# Patient Record
Sex: Female | Born: 1985 | Race: Black or African American | Hispanic: No | Marital: Single | Smoking: Never smoker
Health system: Southern US, Community
[De-identification: ages and names within clinical notes are randomized; demographics above are authoritative.]

## PROBLEM LIST (undated history)

## (undated) DIAGNOSIS — R51 Headache: Secondary | ICD-10-CM

## (undated) DIAGNOSIS — Z789 Other specified health status: Secondary | ICD-10-CM

## (undated) HISTORY — PX: NO PAST SURGERIES: SHX2092

---
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
## 2007-10-30 ENCOUNTER — Emergency Department (HOSPITAL_COMMUNITY): Admission: EM | Admit: 2007-10-30 | Discharge: 2007-10-30 | Payer: Self-pay | Admitting: Emergency Medicine

## 2007-12-01 ENCOUNTER — Emergency Department (HOSPITAL_COMMUNITY): Admission: EM | Admit: 2007-12-01 | Discharge: 2007-12-01 | Payer: Self-pay | Admitting: Emergency Medicine

## 2008-01-02 ENCOUNTER — Other Ambulatory Visit: Admission: RE | Admit: 2008-01-02 | Discharge: 2008-01-02 | Payer: Self-pay | Admitting: Obstetrics and Gynecology

## 2008-03-21 ENCOUNTER — Ambulatory Visit (HOSPITAL_COMMUNITY): Admission: RE | Admit: 2008-03-21 | Discharge: 2008-03-21 | Payer: Self-pay | Admitting: Obstetrics & Gynecology

## 2008-04-23 ENCOUNTER — Ambulatory Visit (HOSPITAL_COMMUNITY): Admission: RE | Admit: 2008-04-23 | Discharge: 2008-04-23 | Payer: Self-pay | Admitting: Obstetrics & Gynecology

## 2008-06-08 ENCOUNTER — Other Ambulatory Visit: Payer: Self-pay | Admitting: Emergency Medicine

## 2008-06-08 ENCOUNTER — Inpatient Hospital Stay (HOSPITAL_COMMUNITY): Admission: AD | Admit: 2008-06-08 | Discharge: 2008-06-10 | Payer: Self-pay | Admitting: Obstetrics & Gynecology

## 2008-06-08 ENCOUNTER — Ambulatory Visit: Payer: Self-pay | Admitting: Obstetrics & Gynecology

## 2008-06-17 ENCOUNTER — Inpatient Hospital Stay (HOSPITAL_COMMUNITY): Admission: AD | Admit: 2008-06-17 | Discharge: 2008-06-19 | Payer: Self-pay | Admitting: Obstetrics & Gynecology

## 2008-06-17 ENCOUNTER — Ambulatory Visit: Payer: Self-pay | Admitting: Family Medicine

## 2008-09-01 ENCOUNTER — Emergency Department (HOSPITAL_COMMUNITY): Admission: EM | Admit: 2008-09-01 | Discharge: 2008-09-01 | Payer: Self-pay | Admitting: Emergency Medicine

## 2008-10-29 ENCOUNTER — Emergency Department (HOSPITAL_COMMUNITY): Admission: EM | Admit: 2008-10-29 | Discharge: 2008-10-30 | Payer: Self-pay | Admitting: Emergency Medicine

## 2009-10-05 ENCOUNTER — Emergency Department (HOSPITAL_COMMUNITY): Admission: EM | Admit: 2009-10-05 | Discharge: 2009-10-05 | Payer: Self-pay | Admitting: Emergency Medicine

## 2010-02-03 ENCOUNTER — Emergency Department (HOSPITAL_COMMUNITY): Admission: EM | Admit: 2010-02-03 | Discharge: 2010-02-03 | Payer: Self-pay | Admitting: Emergency Medicine

## 2010-03-08 ENCOUNTER — Emergency Department (HOSPITAL_COMMUNITY): Admission: EM | Admit: 2010-03-08 | Discharge: 2010-03-08 | Payer: Self-pay | Admitting: Emergency Medicine

## 2010-07-18 ENCOUNTER — Encounter: Payer: Self-pay | Admitting: Obstetrics & Gynecology

## 2010-09-09 LAB — URINALYSIS, ROUTINE W REFLEX MICROSCOPIC
Glucose, UA: NEGATIVE mg/dL
Ketones, ur: NEGATIVE mg/dL
Nitrite: NEGATIVE
Protein, ur: NEGATIVE mg/dL
Urobilinogen, UA: 0.2 mg/dL (ref 0.0–1.0)

## 2010-09-09 LAB — POCT PREGNANCY, URINE: Preg Test, Ur: NEGATIVE

## 2010-09-15 LAB — URINALYSIS, ROUTINE W REFLEX MICROSCOPIC
Bilirubin Urine: NEGATIVE
Glucose, UA: NEGATIVE mg/dL
Hgb urine dipstick: NEGATIVE
Protein, ur: NEGATIVE mg/dL
Urobilinogen, UA: 0.2 mg/dL (ref 0.0–1.0)

## 2010-09-15 LAB — WET PREP, GENITAL: Yeast Wet Prep HPF POC: NONE SEEN

## 2010-09-15 LAB — GC/CHLAMYDIA PROBE AMP, GENITAL
Chlamydia, DNA Probe: NEGATIVE
GC Probe Amp, Genital: NEGATIVE

## 2010-10-05 LAB — DIFFERENTIAL
Basophils Absolute: 0 10*3/uL (ref 0.0–0.1)
Basophils Relative: 1 % (ref 0–1)
Eosinophils Relative: 1 % (ref 0–5)
Lymphocytes Relative: 15 % (ref 12–46)
Monocytes Absolute: 0.6 10*3/uL (ref 0.1–1.0)
Monocytes Relative: 10 % (ref 3–12)
Neutro Abs: 4.1 10*3/uL (ref 1.7–7.7)

## 2010-10-05 LAB — COMPREHENSIVE METABOLIC PANEL
AST: 15 U/L (ref 0–37)
Albumin: 3.9 g/dL (ref 3.5–5.2)
BUN: 9 mg/dL (ref 6–23)
Creatinine, Ser: 0.75 mg/dL (ref 0.4–1.2)
GFR calc Af Amer: >60 mL/min (ref >60)
Potassium: 4 mEq/L (ref 3.5–5.1)
Total Protein: 6.2 g/dL (ref 6.0–8.3)

## 2010-10-05 LAB — URINALYSIS, ROUTINE W REFLEX MICROSCOPIC
Glucose, UA: NEGATIVE mg/dL
Protein, ur: NEGATIVE mg/dL
Urobilinogen, UA: 1 mg/dL (ref 0.0–1.0)

## 2010-10-05 LAB — CBC
HCT: 37.3 % (ref 36.0–46.0)
MCV: 88.1 fL (ref 78.0–100.0)
Platelets: 206 10*3/uL (ref 150–400)
RDW: 12.6 % (ref 11.5–15.5)
WBC: 5.6 10*3/uL (ref 4.0–10.5)

## 2010-11-09 NOTE — Discharge Summary (Signed)
Rose Buchanan, Rose Buchanan             ACCOUNT NO.:  [id number]   MEDICAL RECORD NO.:  [id number]          PATIENT TYPE:  INP   LOCATION:  9156                          FACILITY:  [facility]   PHYSICIAN:  Allie Bossier, MD        DATE OF BIRTH:  30-Dec-1985   DATE OF ADMISSION:  06/08/2008  DATE OF DISCHARGE:  06/10/2008                               DISCHARGE SUMMARY   REASON FOR ADMISSION:  Pregnancy at 32 weeks and 2 days, preterm labor.   Rose Buchanan was admitted on June 08, 2008, with preterm uterine  contractions, dilation of cervix was 1-2, 50% effaced, and -2 station.  She responded very well to mag sulfate therapy.  She was placed on mag  sulfate therapy for 24 hours and titrated off and placed on Procardia 24  hours without any uterine contractions and has responded well to that.   PHYSICAL EXAMINATION:  VITAL SIGNS:  Stable.  HEART:  Regular rhythm and rate.  LUNGS:  Clear to auscultation bilaterally.  ABDOMEN:  Soft, gravid, and nontender.   Review of the fetal heart rate pattern is reassuring.  Cervix is  unchanged.   PLAN:  We are going to discharge her home.  Follow up with [facility]  on Thursday.  She is to be on Procardia 30 mg b.i.d.  If she has any  problems she is to call [facility] or come back and see [place] with any  concerns.      Zerita Boers, N.M.      Allie Bossier, MD  Electronically Signed    DL/MEDQ  D:  16/03/9603  T:  06/10/2008  Job:  [id number]   cc:   Tilda Burrow, M.D.  Fax: [phone]   [facility]   Julieanne Manson  Fax: [phone]

## 2010-11-12 NOTE — Op Note (Signed)
NAMENYKOLE, MATOS NO.:  [id number]   MEDICAL RECORD NO.:  [id number]          PATIENT TYPE:  INP   LOCATION:  A412                          FACILITY:  [facility]   PHYSICIAN:  Lazaro Arms, M.D.   DATE OF BIRTH:  [birth year]   DATE OF PROCEDURE:  09/02/2006  DATE OF DISCHARGE:                                PROCEDURE NOTE   EPIDURAL NOTE:   ATTENDING:  Lazaro Arms, M.D.   INDICATION:  Jakhia is an 25 year old gravida 1 at 7 weeks'  gestation, who presented in spontaneous labor and rupture of membranes,  who has progressed now from  4 to 5-6 cm, requesting epidural be placed  at 0500.   DESCRIPTION OF PROCEDURE:  The patient was placed in the sitting  position.  Betadine prep was used.  1% lidocaine was injected in the L3-  L4 interspace.  The area was field-draped.  A 17-gauge Tuohy needle was  used, loss-of-resistance technique employed and the epidural space was  found with 1 without difficulty.  Ten milliliters of 0.125% bupivacaine  plain were given as a test dose.  Epidural catheter was then fed and an  additional 10 mL were given.  The epidural was then taped down at 5 cm  in the epidural space.  Continuous infusion of 0.125% bupivacaine with 2  mcg/mL of fentanyl was begun at 12 mL an hour.  The patient is getting  good pain relief.  Fetal heart rate tracing is stable and blood pressure  is stable.      Lazaro Arms, M.D.  Electronically Signed     LHE/MEDQ  D:  09/03/2006  T:  09/04/2006  Job:  [id number]

## 2010-11-12 NOTE — Op Note (Signed)
NAMETAYTUM, SCHECK NO.:  [id number]   MEDICAL RECORD NO.:  [id number]          PATIENT TYPE:  INP   LOCATION:  A412                          FACILITY:  [facility]   PHYSICIAN:  Lazaro Arms, M.D.   DATE OF BIRTH:  [birth year]   DATE OF PROCEDURE:  DATE OF DISCHARGE:                               OPERATIVE REPORT   Rose Buchanan is a 25 year old gravida 1 at 34 half weeks' gestation who  presented labor and delivery in spontaneous labor.  She has progressed  nicely through the active phase of labor, did have to have some second-  degree augmentation.  She had epidural placed but that was also cut off  because of her poor pushing strength.   Over an intact perineum [name] delivered a viable female infant at  9:28 with Apgars of 09/09 weighing 5 pounds and 14 ounces, three-vessel  cord.  Cord blood and cord gas sent.  There was compound presentation of  the posterior arm, I had to deliver the hand first keeping the elbow  flexed and then extending at the shoulder.  I then delivered the  anterior shoulder difficulty.  Cord blood and cord gas sent.  There was  a small first-degree laceration which resulted.  It was bleeding little  bit.  I put in a single 2-0 Monocryl suture.  Placenta was intact,  delivered without difficulty.  The uterus was firm below the umbilicus.  The epidural catheter was then removed the infant underwent routine  neonatal resuscitation.  The patient will undergo routine postpartum  care.      Lazaro Arms, M.D.  Electronically Signed     LHE/MEDQ  D:  09/03/2006  T:  09/04/2006  Job:  [id number]

## 2010-11-27 ENCOUNTER — Emergency Department (HOSPITAL_COMMUNITY)
Admission: EM | Admit: 2010-11-27 | Discharge: 2010-11-27 | Disposition: A | Discharge status: Home / Self Care | Payer: Medicaid Other | Attending: Emergency Medicine | Admitting: Emergency Medicine

## 2010-11-27 DIAGNOSIS — R300 Dysuria: Secondary | ICD-10-CM | POA: Insufficient documentation

## 2010-11-27 DIAGNOSIS — R109 Unspecified abdominal pain: Secondary | ICD-10-CM | POA: Insufficient documentation

## 2010-11-27 DIAGNOSIS — O99891 Other specified diseases and conditions complicating pregnancy: Secondary | ICD-10-CM | POA: Insufficient documentation

## 2010-11-27 LAB — URINALYSIS, ROUTINE W REFLEX MICROSCOPIC
Bilirubin Urine: NEGATIVE
Ketones, ur: NEGATIVE mg/dL
Nitrite: NEGATIVE
Protein, ur: NEGATIVE mg/dL

## 2010-11-27 LAB — POCT PREGNANCY, URINE: Preg Test, Ur: POSITIVE

## 2010-12-07 ENCOUNTER — Emergency Department (HOSPITAL_COMMUNITY)
Admission: EM | Admit: 2010-12-07 | Discharge: 2010-12-08 | Discharge status: Against Medical Advice | Payer: Medicaid Other | Attending: Emergency Medicine | Admitting: Emergency Medicine

## 2010-12-07 DIAGNOSIS — R109 Unspecified abdominal pain: Secondary | ICD-10-CM | POA: Insufficient documentation

## 2010-12-07 DIAGNOSIS — O99891 Other specified diseases and conditions complicating pregnancy: Secondary | ICD-10-CM | POA: Insufficient documentation

## 2010-12-09 LAB — ANTIBODY SCREEN: Antibody Screen: NEGATIVE

## 2010-12-09 LAB — HIV ANTIBODY (ROUTINE TESTING W REFLEX): HIV: NONREACTIVE

## 2011-01-04 ENCOUNTER — Other Ambulatory Visit (HOSPITAL_COMMUNITY)
Admission: RE | Admit: 2011-01-04 | Discharge: 2011-01-04 | Disposition: A | Discharge status: Home / Self Care | Payer: Medicaid Other | Source: Ambulatory Visit | Attending: Obstetrics and Gynecology | Admitting: Obstetrics and Gynecology

## 2011-01-04 ENCOUNTER — Other Ambulatory Visit: Payer: Self-pay | Admitting: Family Medicine

## 2011-01-04 DIAGNOSIS — Z01419 Encounter for gynecological examination (general) (routine) without abnormal findings: Secondary | ICD-10-CM | POA: Insufficient documentation

## 2011-01-04 DIAGNOSIS — Z113 Encounter for screening for infections with a predominantly sexual mode of transmission: Secondary | ICD-10-CM | POA: Insufficient documentation

## 2011-02-21 ENCOUNTER — Emergency Department (HOSPITAL_COMMUNITY): Payer: Medicaid Other

## 2011-02-21 ENCOUNTER — Emergency Department (HOSPITAL_COMMUNITY)
Admission: EM | Admit: 2011-02-21 | Discharge: 2011-02-21 | Disposition: A | Discharge status: Home / Self Care | Payer: Medicaid Other | Attending: Emergency Medicine | Admitting: Emergency Medicine

## 2011-02-21 ENCOUNTER — Encounter: Payer: Self-pay | Admitting: Emergency Medicine

## 2011-02-21 DIAGNOSIS — R109 Unspecified abdominal pain: Secondary | ICD-10-CM | POA: Insufficient documentation

## 2011-02-21 DIAGNOSIS — M542 Cervicalgia: Secondary | ICD-10-CM | POA: Insufficient documentation

## 2011-02-21 DIAGNOSIS — O26899 Other specified pregnancy related conditions, unspecified trimester: Secondary | ICD-10-CM

## 2011-02-21 DIAGNOSIS — O99891 Other specified diseases and conditions complicating pregnancy: Secondary | ICD-10-CM | POA: Insufficient documentation

## 2011-02-21 LAB — URINALYSIS, ROUTINE W REFLEX MICROSCOPIC
Bilirubin Urine: NEGATIVE
Hgb urine dipstick: NEGATIVE
Ketones, ur: 40 mg/dL — AB
Nitrite: NEGATIVE
Urobilinogen, UA: 0.2 mg/dL (ref 0.0–1.0)

## 2011-02-21 MED ORDER — ACETAMINOPHEN 325 MG PO TABS
650.0000 mg | ORAL_TABLET | Freq: Once | ORAL | Status: AC
  Administered 2011-02-21: 650 mg via ORAL
  Filled 2011-02-21: qty 2
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
## 2011-02-21 NOTE — ED Notes (Signed)
Family at bedside. 
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
## 2011-02-21 NOTE — ED Notes (Signed)
FHT- 140. Dr. Lynelle Doctor notified.

## 2011-02-21 NOTE — ED Notes (Signed)
Cervical collar in place.

## 2011-02-21 NOTE — ED Notes (Signed)
Resting comfortably on stretcher. No c/o pain at this time.

## 2011-03-24 LAB — URINALYSIS, ROUTINE W REFLEX MICROSCOPIC
Glucose, UA: NEGATIVE
Hgb urine dipstick: NEGATIVE
Ketones, ur: NEGATIVE
Protein, ur: NEGATIVE
pH: 6.5

## 2011-03-24 LAB — URINE MICROSCOPIC-ADD ON

## 2011-03-24 LAB — GC/CHLAMYDIA PROBE AMP, GENITAL
Chlamydia, DNA Probe: POSITIVE — AB
GC Probe Amp, Genital: NEGATIVE

## 2011-03-24 LAB — HCG, QUANTITATIVE, PREGNANCY: hCG, Beta Chain, Quant, S: 28839 — ABNORMAL HIGH

## 2011-04-01 LAB — WET PREP, GENITAL
Clue Cells Wet Prep HPF POC: NONE SEEN
Trich, Wet Prep: NONE SEEN

## 2011-04-01 LAB — GC/CHLAMYDIA PROBE AMP, GENITAL: Chlamydia, DNA Probe: NEGATIVE

## 2011-04-01 LAB — RAPID URINE DRUG SCREEN, HOSP PERFORMED
Benzodiazepines: NOT DETECTED
Tetrahydrocannabinol: NOT DETECTED

## 2011-04-01 LAB — URINE CULTURE

## 2011-04-01 LAB — CBC
HCT: 34.2 % — ABNORMAL LOW (ref 36.0–46.0)
Hemoglobin: 11.4 g/dL — ABNORMAL LOW (ref 12.0–15.0)
MCV: 89.3 fL (ref 78.0–100.0)
MCV: 90.8 fL (ref 78.0–100.0)
Platelets: 203 10*3/uL (ref 150–400)
Platelets: 217 10*3/uL (ref 150–400)
RBC: 4.18 MIL/uL (ref 3.87–5.11)
WBC: 13.6 10*3/uL — ABNORMAL HIGH (ref 4.0–10.5)
WBC: 8.7 10*3/uL (ref 4.0–10.5)

## 2011-04-01 LAB — URINALYSIS, ROUTINE W REFLEX MICROSCOPIC
Protein, ur: NEGATIVE mg/dL
Urobilinogen, UA: 0.2 mg/dL (ref 0.0–1.0)

## 2011-04-01 LAB — STREP B DNA PROBE

## 2011-04-01 LAB — RPR: RPR Ser Ql: NONREACTIVE

## 2011-06-28 NOTE — L&D Delivery Note (Signed)
Operative Delivery Note At 9:45 AM a viable female was delivered via Vaginal, Vacuum Investment banker, operational).  Presentation: vertex; Position: Left,, Occiput,, Posterior; Station: +3. Indication: Recurrent fetal heart rate decelerations.  Verbal consent: obtained from patient.  Risks and benefits discussed in detail.  Risks include, but are not limited to the risks of anesthesia, bleeding, infection, damage to maternal tissues, fetal cephalhematoma.  There is also the risk of inability to effect vaginal delivery of the head, or shoulder dystocia that cannot be resolved by established maneuvers, leading to the need for emergency cesarean section.  APGAR: 8, 8; weight 6 lb 3.1 oz (2809 g).   Arterial cord pH: 7.196 Placenta status: Intact, Spontaneous. Cord: 3 vessels  Anesthesia: Epidural  Instruments: Kiwi Vacuum, with 2 pop-offs. Switched over to Bell vacuum Lacerations: None Est. Blood Loss (mL): 350 ml  Mom to postpartum.  Baby to nursery-stable.  Daisuke Bailey A 07/20/2011, 12:00 PM

## 2011-07-14 LAB — STREP B DNA PROBE: GBS: POSITIVE

## 2011-07-20 ENCOUNTER — Inpatient Hospital Stay (HOSPITAL_COMMUNITY): Payer: Medicaid Other | Admitting: Anesthesiology

## 2011-07-20 ENCOUNTER — Inpatient Hospital Stay (HOSPITAL_COMMUNITY)
Admission: AD | Admit: 2011-07-20 | Discharge: 2011-07-22 | DRG: 775 | Disposition: A | Discharge status: Home / Self Care | Payer: Medicaid Other | Source: Ambulatory Visit | Attending: Obstetrics and Gynecology | Admitting: Obstetrics and Gynecology

## 2011-07-20 ENCOUNTER — Encounter (HOSPITAL_COMMUNITY): Payer: Self-pay | Admitting: Anesthesiology

## 2011-07-20 ENCOUNTER — Encounter (HOSPITAL_COMMUNITY): Payer: Self-pay | Admitting: *Deleted

## 2011-07-20 DIAGNOSIS — O99892 Other specified diseases and conditions complicating childbirth: Secondary | ICD-10-CM

## 2011-07-20 DIAGNOSIS — O9989 Other specified diseases and conditions complicating pregnancy, childbirth and the puerperium: Secondary | ICD-10-CM

## 2011-07-20 DIAGNOSIS — Z22330 Carrier of Group B streptococcus: Secondary | ICD-10-CM

## 2011-07-20 DIAGNOSIS — IMO0001 Reserved for inherently not codable concepts without codable children: Secondary | ICD-10-CM

## 2011-07-20 HISTORY — DX: Other specified health status: Z78.9

## 2011-07-20 LAB — CBC
HCT: 38.7 % (ref 36.0–46.0)
MCH: 28.8 pg (ref 26.0–34.0)
MCV: 86.4 fL (ref 78.0–100.0)
Platelets: 224 10*3/uL (ref 150–400)
RBC: 4.48 MIL/uL (ref 3.87–5.11)
RDW: 13.5 % (ref 11.5–15.5)

## 2011-07-20 MED ORDER — BENZOCAINE-MENTHOL 20-0.5 % EX AERO
INHALATION_SPRAY | CUTANEOUS | Status: AC
  Filled 2011-07-20: qty 56

## 2011-07-20 MED ORDER — FENTANYL 2.5 MCG/ML BUPIVACAINE 1/10 % EPIDURAL INFUSION (WH - ANES)
INTRAMUSCULAR | Status: DC | PRN
  Administered 2011-07-20: 14 mL/h via EPIDURAL

## 2011-07-20 MED ORDER — SODIUM BICARBONATE 8.4 % IV SOLN
INTRAVENOUS | Status: DC | PRN
  Administered 2011-07-20: 4 mL via EPIDURAL

## 2011-07-20 MED ORDER — LIDOCAINE HCL (PF) 1 % IJ SOLN
30.0000 mL | INTRAMUSCULAR | Status: DC | PRN
  Filled 2011-07-20: qty 30

## 2011-07-20 MED ORDER — IBUPROFEN 600 MG PO TABS: 600.0000 mg | ORAL_TABLET | Freq: Four times a day (QID) | ORAL | Status: DC | PRN

## 2011-07-20 MED ORDER — METHYLERGONOVINE MALEATE 0.2 MG/ML IJ SOLN: 0.2000 mg | INTRAMUSCULAR | Status: DC | PRN

## 2011-07-20 MED ORDER — DEXTROSE 5 % IV SOLN
2.5000 10*6.[IU] | INTRAVENOUS | Status: DC
  Filled 2011-07-20 (×2): qty 2.5

## 2011-07-20 MED ORDER — ZOLPIDEM TARTRATE 5 MG PO TABS: 5.0000 mg | ORAL_TABLET | Freq: Every evening | ORAL | Status: DC | PRN

## 2011-07-20 MED ORDER — OXYTOCIN 20 UNITS IN LACTATED RINGERS INFUSION - SIMPLE: 125.0000 mL/h | Freq: Once | INTRAVENOUS | Status: DC

## 2011-07-20 MED ORDER — LACTATED RINGERS IV SOLN
INTRAVENOUS | Status: DC
  Administered 2011-07-20: 500 mL via INTRAUTERINE

## 2011-07-20 MED ORDER — FENTANYL 2.5 MCG/ML BUPIVACAINE 1/10 % EPIDURAL INFUSION (WH - ANES)
14.0000 mL/h | INTRAMUSCULAR | Status: DC
Start: 2011-07-20 — End: 2011-07-20
  Filled 2011-07-20: qty 60

## 2011-07-20 MED ORDER — WITCH HAZEL-GLYCERIN EX PADS: 1.0000 "application " | MEDICATED_PAD | CUTANEOUS | Status: DC | PRN

## 2011-07-20 MED ORDER — SIMETHICONE 80 MG PO CHEW: 80.0000 mg | CHEWABLE_TABLET | ORAL | Status: DC | PRN

## 2011-07-20 MED ORDER — IBUPROFEN 600 MG PO TABS
600.0000 mg | ORAL_TABLET | Freq: Four times a day (QID) | ORAL | Status: DC
  Administered 2011-07-20 – 2011-07-22 (×8): 600 mg via ORAL
  Filled 2011-07-20 (×8): qty 1

## 2011-07-20 MED ORDER — DIPHENHYDRAMINE HCL 25 MG PO CAPS: 25.0000 mg | ORAL_CAPSULE | Freq: Four times a day (QID) | ORAL | Status: DC | PRN

## 2011-07-20 MED ORDER — DIBUCAINE 1 % RE OINT
1.0000 "application " | TOPICAL_OINTMENT | RECTAL | Status: DC | PRN
  Administered 2011-07-20: 1 via RECTAL
  Filled 2011-07-20: qty 28

## 2011-07-20 MED ORDER — SENNOSIDES-DOCUSATE SODIUM 8.6-50 MG PO TABS
2.0000 | ORAL_TABLET | Freq: Every day | ORAL | Status: DC
  Administered 2011-07-20 – 2011-07-21 (×2): 2 via ORAL

## 2011-07-20 MED ORDER — METHYLERGONOVINE MALEATE 0.2 MG PO TABS: 0.2000 mg | ORAL_TABLET | ORAL | Status: DC | PRN

## 2011-07-20 MED ORDER — PRENATAL MULTIVITAMIN CH
1.0000 | ORAL_TABLET | Freq: Every day | ORAL | Status: DC
  Administered 2011-07-21 – 2011-07-22 (×2): 1 via ORAL
  Filled 2011-07-20 (×2): qty 1

## 2011-07-20 MED ORDER — LACTATED RINGERS IV SOLN
INTRAVENOUS | Status: DC
  Administered 2011-07-20: 08:00:00 via INTRAVENOUS

## 2011-07-20 MED ORDER — MAGNESIUM HYDROXIDE 400 MG/5ML PO SUSP: 30.0000 mL | ORAL | Status: DC | PRN

## 2011-07-20 MED ORDER — EPHEDRINE 5 MG/ML INJ: 10.0000 mg | INTRAVENOUS | Status: DC | PRN

## 2011-07-20 MED ORDER — ONDANSETRON HCL 4 MG PO TABS: 4.0000 mg | ORAL_TABLET | ORAL | Status: DC | PRN

## 2011-07-20 MED ORDER — ONDANSETRON HCL 4 MG/2ML IJ SOLN: 4.0000 mg | Freq: Four times a day (QID) | INTRAMUSCULAR | Status: DC | PRN

## 2011-07-20 MED ORDER — ONDANSETRON HCL 4 MG/2ML IJ SOLN: 4.0000 mg | INTRAMUSCULAR | Status: DC | PRN

## 2011-07-20 MED ORDER — OXYCODONE-ACETAMINOPHEN 5-325 MG PO TABS: 2.0000 | ORAL_TABLET | ORAL | Status: DC | PRN

## 2011-07-20 MED ORDER — LANOLIN HYDROUS EX OINT: 1.0000 "application " | TOPICAL_OINTMENT | CUTANEOUS | Status: DC | PRN

## 2011-07-20 MED ORDER — OXYTOCIN 10 UNIT/ML IJ SOLN
INTRAMUSCULAR | Status: AC
  Filled 2011-07-20: qty 2

## 2011-07-20 MED ORDER — PHENYLEPHRINE 40 MCG/ML (10ML) SYRINGE FOR IV PUSH (FOR BLOOD PRESSURE SUPPORT): 80.0000 ug | PREFILLED_SYRINGE | INTRAVENOUS | Status: DC | PRN

## 2011-07-20 MED ORDER — EPHEDRINE 5 MG/ML INJ
10.0000 mg | INTRAVENOUS | Status: DC | PRN
  Filled 2011-07-20: qty 4

## 2011-07-20 MED ORDER — ACETAMINOPHEN 325 MG PO TABS: 650.0000 mg | ORAL_TABLET | ORAL | Status: DC | PRN

## 2011-07-20 MED ORDER — FLEET ENEMA 7-19 GM/118ML RE ENEM: 1.0000 | ENEMA | RECTAL | Status: DC | PRN

## 2011-07-20 MED ORDER — TETANUS-DIPHTH-ACELL PERTUSSIS 5-2.5-18.5 LF-MCG/0.5 IM SUSP
0.5000 mL | Freq: Once | INTRAMUSCULAR | Status: AC
  Administered 2011-07-21: 0.5 mL via INTRAMUSCULAR
  Filled 2011-07-20: qty 0.5

## 2011-07-20 MED ORDER — TERBUTALINE SULFATE 1 MG/ML IJ SOLN
INTRAMUSCULAR | Status: AC
  Administered 2011-07-20: 0.25 mg via SUBCUTANEOUS
  Filled 2011-07-20: qty 1

## 2011-07-20 MED ORDER — PENICILLIN G POTASSIUM 5000000 UNITS IJ SOLR
5.0000 10*6.[IU] | Freq: Once | INTRAVENOUS | Status: AC
  Administered 2011-07-20: 5 10*6.[IU] via INTRAVENOUS
  Filled 2011-07-20: qty 5

## 2011-07-20 MED ORDER — MEASLES, MUMPS & RUBELLA VAC ~~LOC~~ INJ
0.5000 mL | INJECTION | Freq: Once | SUBCUTANEOUS | Status: AC | PRN
  Filled 2011-07-20: qty 0.5

## 2011-07-20 MED ORDER — LACTATED RINGERS IV SOLN
500.0000 mL | Freq: Once | INTRAVENOUS | Status: AC
  Administered 2011-07-20: 1000 mL via INTRAVENOUS

## 2011-07-20 MED ORDER — BENZOCAINE-MENTHOL 20-0.5 % EX AERO
1.0000 "application " | INHALATION_SPRAY | CUTANEOUS | Status: DC | PRN
  Administered 2011-07-20: 1 via TOPICAL

## 2011-07-20 MED ORDER — OXYCODONE-ACETAMINOPHEN 5-325 MG PO TABS
1.0000 | ORAL_TABLET | ORAL | Status: DC | PRN
  Administered 2011-07-20: 1 via ORAL
  Administered 2011-07-20 – 2011-07-21 (×3): 2 via ORAL
  Administered 2011-07-22: 1 via ORAL
  Filled 2011-07-20 (×2): qty 2
  Filled 2011-07-20 (×2): qty 1
  Filled 2011-07-20: qty 2

## 2011-07-20 MED ORDER — FERROUS SULFATE 325 (65 FE) MG PO TABS
325.0000 mg | ORAL_TABLET | Freq: Two times a day (BID) | ORAL | Status: DC
  Administered 2011-07-20 – 2011-07-22 (×4): 325 mg via ORAL
  Filled 2011-07-20 (×4): qty 1

## 2011-07-20 MED ORDER — DIPHENHYDRAMINE HCL 50 MG/ML IJ SOLN: 12.5000 mg | INTRAMUSCULAR | Status: DC | PRN

## 2011-07-20 MED ORDER — LACTATED RINGERS IV SOLN
500.0000 mL | INTRAVENOUS | Status: DC | PRN
  Administered 2011-07-20: 500 mL via INTRAVENOUS

## 2011-07-20 MED ORDER — PHENYLEPHRINE 40 MCG/ML (10ML) SYRINGE FOR IV PUSH (FOR BLOOD PRESSURE SUPPORT)
80.0000 ug | PREFILLED_SYRINGE | INTRAVENOUS | Status: DC | PRN
  Administered 2011-07-20: 80 ug via INTRAVENOUS
  Filled 2011-07-20: qty 5

## 2011-07-20 MED ORDER — OXYTOCIN BOLUS FROM INFUSION
500.0000 mL | Freq: Once | INTRAVENOUS | Status: DC
  Filled 2011-07-20: qty 500

## 2011-07-20 MED ORDER — CITRIC ACID-SODIUM CITRATE 334-500 MG/5ML PO SOLN
30.0000 mL | ORAL | Status: DC | PRN
  Filled 2011-07-20: qty 15

## 2011-07-20 NOTE — Progress Notes (Signed)
Notified of no cervical change.  Notified of pt request for pain medicine.  Will call back with orders.

## 2011-07-20 NOTE — ED Provider Notes (Signed)
History   26 y/o F [id number] with 38.5weeks that presents to MAU with painful contractions since 2:00am. No vaginal discharge, fluid leakage or bleeding. No headaches visual disturbances or epigastric pain.  OB history important for prior preterm baby; pt received 17P.  Chief Complaint  Patient presents with  . Labor Eval   HPI  OB History    Grav Para Term Preterm Abortions TAB SAB Ect Mult Living   3 2 1 1  0 0 0 0 0 2      Past Medical History  Diagnosis Date  . No pertinent past medical history     Past Surgical History  Procedure Date  . No past surgeries     History reviewed. No pertinent family history.  History  Substance Use Topics  . Smoking status: Never Smoker   . Smokeless tobacco: Not on file  . Alcohol Use: No    Allergies: No Known Allergies  Prescriptions prior to admission  Medication Sig Dispense Refill  . Prenatal Vit-Fe Sulfate-FA (PRENATAL VITAMIN PO) Take 1 tablet by mouth daily.        Marland Kitchen UNABLE TO FIND Inject 1 each into the muscle once a week. 17 P = Alpha Hydroxy Progesterone         Review of Systems  Constitutional: Negative for fever.  Eyes: Negative for blurred vision.  Respiratory: Negative.   Cardiovascular: Negative for chest pain.  Gastrointestinal: Positive for abdominal pain and diarrhea. Negative for nausea and vomiting.  Genitourinary: Negative.   Skin: Negative.   Neurological: Negative.  Negative for headaches.   Physical Exam   Blood pressure 127/75, pulse 103, temperature 98.4 F (36.9 C), resp. rate 18, height 5\' 3"  (1.6 m), weight 80.287 kg (177 lb), last menstrual period 10/23/2010.  Physical Exam  Constitutional: No distress.  HENT:  Mouth/Throat: No oropharyngeal exudate.  Eyes: Conjunctivae are normal.  Neck: Neck supple.  Cardiovascular: Normal rate, regular rhythm and normal heart sounds.   No murmur heard. Respiratory: Effort normal and breath sounds normal. No respiratory distress.  GI: Soft. Bowel  sounds are normal.       Normal abdominal physical exam of a 38w pregnant pt.  Genitourinary: Vagina normal.       Cervix 3.5 70% effaced station -3    MAU Course  Procedures  MDM FHT category 2 on admission that changed to category 1 with repositioning.  Now reassuring. Cervical charges from 3.5- 5 cm in 2 hours.  Assessment and Plan  26 y/o F [id number] with 38.5weeks on latent phase of labor. Continue FH monitoring' Recheck in an hour to evaluate for cervical changes.- positive cervical changes   Admit to L and D  D. [place] [employer]. MD PGY-1  07/20/2011, 5:10 AM

## 2011-07-20 NOTE — Progress Notes (Signed)
Pt reports contractions x 2 hours.

## 2011-07-20 NOTE — Anesthesia Procedure Notes (Addendum)

## 2011-07-20 NOTE — Anesthesia Preprocedure Evaluation (Signed)

## 2011-07-20 NOTE — H&P (Signed)
ILAH BOULE is a 26 y.o. female presenting for painful contractions since 2:00am. No vaginal discharge, fluid leakage or bleeding. No headaches visual disturbances or epigastric pain.  OB history important for prior preterm baby; pt received 17P . Also she is  GBS positive.  History OB History    Grav Para Term Preterm Abortions TAB SAB Ect Mult Living   3 2 1 1  0 0 0 0 0 2     Past Medical History  Diagnosis Date  . No pertinent past medical history    Past Surgical History  Procedure Date  . No past surgeries    Family History: family history is not on file. Social History:  reports that she has never smoked. She does not have any smokeless tobacco history on file. She reports that she does not drink alcohol or use illicit drugs.  ROS Review of Systems  Constitutional: Negative for fever.  Eyes: Negative for blurred vision.  Respiratory: Negative.  Cardiovascular: Negative for chest pain.  Gastrointestinal: Positive for abdominal pain and diarrhea. Negative for nausea and vomiting.  Genitourinary: Negative.  Skin: Negative.  Neurological: Negative. Negative for headaches.   Dilation: 5 Effacement (%): 80 Station: -2 Exam by:: SBeck, RN Blood pressure 127/75, pulse 103, temperature 98.4 F (36.9 C), resp. rate 18, height 5\' 3"  (1.6 m), weight 80.287 kg (177 lb), last menstrual period 10/23/2010. Exam Physical Exam  Physical Exam  Constitutional: No distress.  HENT:  Mouth/Throat: No oropharyngeal exudate.  Eyes: Conjunctivae are normal.  Neck: Neck supple.  Cardiovascular: Normal rate, regular rhythm and normal heart sounds.  No murmur heard.  Respiratory: Effort normal and breath sounds normal. No respiratory distress.  GI: Soft. Bowel sounds are normal.  Normal abdominal physical exam of a 38w pregnant pt.  Genitourinary: Vagina normal.  Cervix 5 cm  80% effaced station -1   Prenatal labs: ABO, Rh: --/--/A POS (08/27 1623) Rubella:  inmune RPR:    negative HBsAg:   negative HIV:   no reactive GBS: Positive (01/17 0751)  2h GTT 72/86/96 U/S repeated due to difficult cardiac visualization (positional) repeated U/S with no major abnormalities noted.  Assessment: 1. Labor: active phase. 2. Fetal Wellbeing: Category 1  3. Pain Control: wants epidural. 4. GBS: positive 5. 38.5 week IUP  Plan:  1. Admit to BS 2. Routine L&D orders 3. Analgesia/anesthesia PRN     PILOTO, Teddrick Mallari 07/20/2011, 7:55 AM

## 2011-07-20 NOTE — Consult Note (Signed)
Neonatology Note:   Attendance at Delivery:    I was asked to attend this vacuum-assisted vaginal delivery at term due to late FHR decelerations after epidural was placed. The mother is a G3P2 with an uncomplicated pregnancy. Fluid clear. Infant dusky, needed stimulation and bulb suctioning to initiate good spontaneous cry per OB nurse in attendance. Our team arrived at about 2 minutes of life, baby pale and dusky. Pulse oximeter placed, O2 saturation was upper 80s, but not increasing with age, so given BBO2 for 1-2 minutes with good response.Lungs clear to ausc in DR. BBO2 withdrawn, O2 saturations 100% in rrom air at 10 minutes of age. Ap 8/8/9. I instructed nurse to observe the baby closely for further desaturation and, if there are problems, to take her to the CN for evaluation. Transferred to care of Pediatrician.   Deatra James, MD

## 2011-07-20 NOTE — Progress Notes (Signed)
Dr. Aviva Signs at bedside.  Assessment done and poc discussed with pt.

## 2011-07-20 NOTE — Progress Notes (Signed)
Notified of VE and ctx pattern. Notified of fetal decels. Will be up to see pt.

## 2011-07-20 NOTE — ED Notes (Signed)
Dr. Vernie Murders notified pt requesting pain meds, order to recheck pt's cervix and call back with status.

## 2011-07-21 LAB — CBC
MCH: 28.8 pg (ref 26.0–34.0)
Platelets: 183 10*3/uL (ref 150–400)
RBC: 3.86 MIL/uL — ABNORMAL LOW (ref 3.87–5.11)
WBC: 11.4 10*3/uL — ABNORMAL HIGH (ref 4.0–10.5)

## 2011-07-21 NOTE — Progress Notes (Signed)
Post Partum Day 1 VAVD  Subjective: no complaints, up ad lib, voiding and tolerating PO. Bottlefeeding.  Objective: Blood pressure 100/63, pulse 99, temperature 98.2 F (36.8 C), temperature source Oral, resp. rate 18, height 5\' 3"  (1.6 m), weight 80.287 kg (177 lb), last menstrual period 10/23/2010, SpO2 100.00%, unknown if currently breastfeeding.  GBS pos  Physical Exam:  General: alert, cooperative and no distress Lochia: appropriate Uterine Fundus: firm DVT Evaluation: No evidence of DVT seen on physical exam.   Basename 07/21/11 0535 07/20/11 0810  HGB 11.1* 12.9  HCT 33.7* 38.7    Assessment/Plan: Plan for discharge tomorrow and Contraception Nexplanon   LOS: 1 day   Rose Buchanan 07/21/2011, 7:14 AM

## 2011-07-21 NOTE — H&P (Signed)
Agree with above note.  Shaunessy Dobratz 07/21/2011 8:40 AM

## 2011-07-21 NOTE — Progress Notes (Signed)
UR chart review completed.  

## 2011-07-21 NOTE — Anesthesia Postprocedure Evaluation (Signed)
  Anesthesia Post Note  Patient: Rose Buchanan  Procedure(s) Performed: * No procedures listed *  Anesthesia type: Epidural  Patient location: Mother/Baby  Post pain: Pain level controlled  Post assessment: Post-op Vital signs reviewed  Last Vitals:  Filed Vitals:   07/21/11 1400  BP: 100/68  Pulse: 95  Temp: 36.9 C  Resp: 19    Post vital signs: Reviewed  Level of consciousness:alert  Complications: No apparent anesthesia complications

## 2011-07-22 NOTE — Progress Notes (Signed)
Post Partum Day 2 Subjective: no complaints, up ad lib, voiding, tolerating PO and bottle feeding  Objective: Blood pressure 111/76, pulse 88, temperature 98.2 F (36.8 C), temperature source Oral, resp. rate 18, height 5\' 3"  (1.6 m), weight 80.287 kg (177 lb), last menstrual period 10/23/2010, SpO2 100.00%, unknown if currently breastfeeding.  Physical Exam:  General: alert, cooperative and no distress Lochia: appropriate Uterine Fundus: firm Incision: NA DVT Evaluation: No evidence of DVT seen on physical exam. Negative Homan's sign.   Basename 07/21/11 0535 07/20/11 0810  HGB 11.1* 12.9  HCT 33.7* 38.7    Assessment/Plan: Discharge home and Contraception Nexplanon   LOS: 2 days   Arthor Captain 07/22/2011, 7:52 AM

## 2011-07-22 NOTE — Discharge Summary (Signed)
Obstetric Discharge Summary Reason for Admission: onset of labor Prenatal Procedures: NST Intrapartum Procedures: vacuum Postpartum Procedures: none Complications-Operative and Postpartum: none Hemoglobin  Date Value Range Status  07/21/2011 11.1* 12.0-15.0 (g/dL) Final     HCT  Date Value Range Status  07/21/2011 33.7* 36.0-46.0 (%) Final    Discharge Diagnoses: Term Pregnancy-delivered  Discharge Information: Date: 07/22/2011 Activity: unrestricted Diet: routine Medications: PNV Condition: stable Instructions: refer to practice specific booklet Discharge to: home Follow-up Information    Follow up with [facility] in 4 weeks.   Contact information:   [address] [place] [address]          Newborn Data: Live born female  Birth Weight: 6 lb 3.1 oz (2809 g) APGAR: 8, 8  Home with mother.  Arthor Captain 07/22/2011, 8:55 AM

## 2011-07-26 NOTE — ED Provider Notes (Signed)
I have seen and examined this patient and I agree with the above. Clelia Croft, KIMBERLY 4:09 PM 07/26/2011

## 2012-08-15 ENCOUNTER — Emergency Department (HOSPITAL_COMMUNITY)
Admission: EM | Admit: 2012-08-15 | Discharge: 2012-08-16 | Disposition: A | Discharge status: Home / Self Care | Payer: Self-pay | Attending: Emergency Medicine | Admitting: Emergency Medicine

## 2012-08-15 ENCOUNTER — Encounter (HOSPITAL_COMMUNITY): Payer: Self-pay | Admitting: Emergency Medicine

## 2012-08-15 DIAGNOSIS — R059 Cough, unspecified: Secondary | ICD-10-CM | POA: Insufficient documentation

## 2012-08-15 DIAGNOSIS — R51 Headache: Secondary | ICD-10-CM | POA: Insufficient documentation

## 2012-08-15 DIAGNOSIS — R05 Cough: Secondary | ICD-10-CM | POA: Insufficient documentation

## 2012-08-15 DIAGNOSIS — R42 Dizziness and giddiness: Secondary | ICD-10-CM | POA: Insufficient documentation

## 2012-08-15 DIAGNOSIS — R042 Hemoptysis: Secondary | ICD-10-CM | POA: Insufficient documentation

## 2012-08-15 DIAGNOSIS — Z79899 Other long term (current) drug therapy: Secondary | ICD-10-CM | POA: Insufficient documentation

## 2012-08-15 HISTORY — DX: Headache: R51

## 2012-08-15 MED ORDER — DEXAMETHASONE SODIUM PHOSPHATE 4 MG/ML IJ SOLN: 8.0000 mg | Freq: Once | INTRAMUSCULAR | Status: DC

## 2012-08-15 MED ORDER — DIPHENHYDRAMINE HCL 25 MG PO CAPS
25.0000 mg | ORAL_CAPSULE | Freq: Once | ORAL | Status: AC
Start: 2012-08-16 — End: 2012-08-16
  Administered 2012-08-16: 25 mg via ORAL
  Filled 2012-08-15: qty 1

## 2012-08-15 MED ORDER — KETOROLAC TROMETHAMINE 60 MG/2ML IM SOLN
60.0000 mg | Freq: Once | INTRAMUSCULAR | Status: AC
  Administered 2012-08-16: 60 mg via INTRAMUSCULAR
  Filled 2012-08-15: qty 2

## 2012-08-15 MED ORDER — ONDANSETRON 4 MG PO TBDP
4.0000 mg | ORAL_TABLET | Freq: Once | ORAL | Status: AC
  Administered 2012-08-16: 4 mg via ORAL
  Filled 2012-08-15: qty 1

## 2012-08-15 MED ORDER — DEXAMETHASONE SODIUM PHOSPHATE 4 MG/ML IJ SOLN
8.0000 mg | Freq: Once | INTRAMUSCULAR | Status: AC
  Administered 2012-08-16: 8 mg via INTRAMUSCULAR
  Filled 2012-08-15: qty 2

## 2012-08-15 NOTE — ED Notes (Signed)
Pt reports headache all day. Took ibuprofen around 2030 w/ no relief. Pt states she cough up some blood tonight. States that was the first this has happened.

## 2012-08-15 NOTE — ED Notes (Signed)
Patient states has had a headache all day; states got in the shower and the right side got worse after shower.  Patient states she coughed 4-5 times and noticed bright red blood when she coughed.

## 2012-08-16 NOTE — ED Notes (Signed)
Pt alert & oriented x4, stable gait. Patient given discharge instructions, paperwork & prescription(s). Patient  instructed to stop at the registration desk to finish any additional paperwork. Patient verbalized understanding. Pt left department w/ no further questions. 

## 2012-08-16 NOTE — ED Provider Notes (Signed)
History     CSN: [id number]  Arrival date & time 08/15/12  2104   First MD Initiated Contact with Patient 08/15/12 2339      Chief Complaint  Patient presents with  . Headache    (Consider location/radiation/quality/duration/timing/severity/associated sxs/prior treatment) HPI Rose Buchanan is a 27 y.o. female who presents to the Emergency Department complaining of headache present since this morning. She has taken an ibuprofen without relief. Got in the shower earlier this evening and felt dizzy, coughed up some blood. Her grandmother suggested she come in and get checked out. Past Medical History  Diagnosis Date  . No pertinent past medical history   . Headache     Past Surgical History  Procedure Laterality Date  . No past surgeries      No family history on file.  History  Substance Use Topics  . Smoking status: Never Smoker   . Smokeless tobacco: Not on file  . Alcohol Use: No    OB History   Grav Para Term Preterm Abortions TAB SAB Ect Mult Living   3 3 2 1  0 0 0 0 0 3      Review of Systems  Constitutional: Negative for fever.       10 Systems reviewed and are negative for acute change except as noted in the HPI.  HENT: Negative for congestion.   Eyes: Negative for discharge and redness.  Respiratory: Negative for cough and shortness of breath.   Cardiovascular: Negative for chest pain.  Gastrointestinal: Negative for vomiting and abdominal pain.  Musculoskeletal: Negative for back pain.  Skin: Negative for rash.  Neurological: Positive for dizziness and headaches. Negative for syncope and numbness.  Psychiatric/Behavioral:       No behavior change.    Allergies  Review of patient's allergies indicates no known allergies.  Home Medications   Current Outpatient Rx  Name  Route  Sig  Dispense  Refill  . acetaminophen (TYLENOL) 325 MG tablet   Oral   Take 650 mg by mouth every 6 (six) hours as needed. For pain         . Calcium Carbonate  Antacid (TUMS PO)   Oral   Take 1 tablet by mouth daily.         . Prenatal Vit-Fe Fumarate-FA (PRENATAL MULTIVITAMIN) TABS   Oral   Take 1 tablet by mouth daily.           BP 116/72  Pulse 89  Temp(Src) 98.1 F (36.7 C) (Oral)  Resp 18  Ht 5\' 3"  (1.6 m)  Wt 158 lb (71.668 kg)  BMI 28 kg/m2  SpO2 99%  LMP 08/01/2012  Physical Exam  Nursing note and vitals reviewed. Constitutional:  Awake, alert, nontoxic appearance.  HENT:  Head: Atraumatic.  Right Ear: External ear normal.  Left Ear: External ear normal.  Mouth/Throat: Oropharynx is clear and moist.  Eyes: Right eye exhibits no discharge. Left eye exhibits no discharge.  Neck: Neck supple.  Cardiovascular: Normal rate.   Pulmonary/Chest: Effort normal and breath sounds normal. She exhibits no tenderness.  Abdominal: Soft. Bowel sounds are normal. There is no tenderness. There is no rebound.  Musculoskeletal: She exhibits no tenderness.  Baseline ROM, no obvious new focal weakness.  Neurological:  Mental status and motor strength appears baseline for patient and situation.  Skin: No rash noted.  Psychiatric: She has a normal mood and affect.    ED Course  Procedures (including critical care time)    1247  Pain is now a 2/10. She feels better and ready to go home.  MDM  Patient with a headache since this morning. She has had a headache intermittently for a month. Received a cocktail. Pain improved. Pt stable in ED with no significant deterioration in condition.The patient appears reasonably screened and/or stabilized for discharge and I doubt any other medical condition or other [facility] requiring further screening, evaluation, or treatment in the ED at this time prior to discharge.  MDM Reviewed: nursing note and vitals           Nicoletta Dress. Colon Branch, MD 08/16/12 [phone]
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
## 2014-04-28 ENCOUNTER — Encounter (HOSPITAL_COMMUNITY): Payer: Self-pay | Admitting: Emergency Medicine

## 2014-08-19 ENCOUNTER — Other Ambulatory Visit: Payer: Self-pay | Admitting: Adult Health

## 2014-08-20 ENCOUNTER — Other Ambulatory Visit: Payer: Self-pay | Admitting: Adult Health

## 2014-08-27 ENCOUNTER — Other Ambulatory Visit: Payer: Self-pay | Admitting: Adult Health

## 2014-08-27 ENCOUNTER — Encounter: Payer: Self-pay | Admitting: Adult Health

## 2014-09-03 ENCOUNTER — Encounter: Payer: Self-pay | Admitting: Advanced Practice Midwife

## 2015-05-02 ENCOUNTER — Emergency Department (HOSPITAL_COMMUNITY)
Admission: EM | Admit: 2015-05-02 | Discharge: 2015-05-02 | Disposition: A | Discharge status: Home / Self Care | Payer: Medicaid Other | Attending: Emergency Medicine | Admitting: Emergency Medicine

## 2015-05-02 ENCOUNTER — Encounter (HOSPITAL_COMMUNITY): Payer: Self-pay | Admitting: *Deleted

## 2015-05-02 DIAGNOSIS — Z79899 Other long term (current) drug therapy: Secondary | ICD-10-CM | POA: Insufficient documentation

## 2015-05-02 DIAGNOSIS — L089 Local infection of the skin and subcutaneous tissue, unspecified: Secondary | ICD-10-CM | POA: Insufficient documentation

## 2015-05-02 MED ORDER — IBUPROFEN 800 MG PO TABS
800.0000 mg | ORAL_TABLET | Freq: Once | ORAL | Status: AC
  Administered 2015-05-02: 800 mg via ORAL
  Filled 2015-05-02: qty 1

## 2015-05-02 MED ORDER — DOXYCYCLINE HYCLATE 100 MG PO CAPS: 100.0000 mg | ORAL_CAPSULE | Freq: Two times a day (BID) | ORAL | Status: DC

## 2015-05-02 MED ORDER — DOXYCYCLINE HYCLATE 100 MG PO TABS
100.0000 mg | ORAL_TABLET | Freq: Once | ORAL | Status: AC
  Administered 2015-05-02: 100 mg via ORAL
  Filled 2015-05-02: qty 1

## 2015-05-02 NOTE — ED Notes (Signed)
Patient reports tattoo x 2 weeks, noticed two "white pimples" that started two days ago, states area around pimples is red and "hard".

## 2015-05-02 NOTE — ED Provider Notes (Signed)
CSN: [id number]     Arrival date & time 05/02/15  6962 History   First MD Initiated Contact with Patient 05/02/15 [phone]     Chief Complaint  Patient presents with  . Abscess     (Consider location/radiation/quality/duration/timing/severity/associated sxs/prior Treatment) Patient is a 29 y.o. female presenting with abscess. The history is provided by the patient.  Abscess Location:  Leg Leg abscess location:  R upper leg Abscess quality: redness and warmth   Abscess quality: not draining   Red streaking: no   Duration:  2 days Progression:  Worsening Chronicity:  New Context: not diabetes and not immunosuppression   Relieved by:  Nothing Ineffective treatments:  None tried Associated symptoms: no fever and no nausea   Risk factors: no hx of MRSA and no prior abscess     Past Medical History  Diagnosis Date  . No pertinent past medical history   . UXLKGMWN(027.2)    Past Surgical History  Procedure Laterality Date  . No past surgeries     History reviewed. No pertinent family history. Social History  Substance Use Topics  . Smoking status: Never Smoker   . Smokeless tobacco: None  . Alcohol Use: No   OB History    Gravida Para Term Preterm AB TAB SAB Ectopic Multiple Living   0 0 0 0 0 3     Review of Systems  Constitutional: Negative for fever.  Gastrointestinal: Negative for nausea.      Allergies  Review of patient's allergies indicates no known allergies.  Home Medications   Prior to Admission medications   Medication Sig Start Date End Date Taking? Authorizing Provider  acetaminophen (TYLENOL) 325 MG tablet Take 650 mg by mouth every 6 (six) hours as needed. For pain    Historical Provider, MD  Calcium Carbonate Antacid (TUMS PO) Take 1 tablet by mouth daily.    Historical Provider, MD  Prenatal Vit-Fe Fumarate-FA (PRENATAL MULTIVITAMIN) TABS Take 1 tablet by mouth daily.    Historical Provider, MD   BP 141/81 mmHg  Pulse 90  Temp(Src)  98.2 F (36.8 C) (Oral)  Resp 18  Ht  (1.6 m)  Wt 167 lb (75.751 kg)  BMI 29.59 kg/m2  SpO2 99%  LMP  Physical Exam  Constitutional: She is oriented to person, place, and time. She appears well-developed and well-nourished.  Non-toxic appearance.  HENT:  Head: Normocephalic.  Right Ear: Tympanic membrane and external ear normal.  Left Ear: Tympanic membrane and external ear normal.  Eyes: EOM and lids are normal. Pupils are equal, round, and reactive to light.  Neck: Normal range of motion. Neck supple. Carotid bruit is not present.  Cardiovascular: Normal rate, regular rhythm, normal heart sounds, intact distal pulses and normal pulses.   Pulmonary/Chest: Breath sounds normal. No respiratory distress.  Abdominal: Soft. Bowel sounds are normal. There is no tenderness. There is no guarding.  Musculoskeletal: Normal range of motion. She exhibits no edema.  Several red slightly raised areas, some with pustules. No red streaks. No drainage.  Lymphadenopathy:       Head (right side): No submandibular adenopathy present.       Head (left side): No submandibular adenopathy present.    She has no cervical adenopathy.  Neurological: She is alert and oriented to person, place, and time. She has normal strength. No cranial nerve deficit or sensory deficit.  Skin: Skin is warm and dry.  Psychiatric: She has a normal mood and affect. Her  speech is normal.  Nursing note and vitals reviewed.   ED Course  Procedures (including critical care time) Labs Review Labs Reviewed - No data to display  Imaging Review No results found. I have personally reviewed and evaluated these images and lab results as part of my medical decision-making.   EKG Interpretation None      MDM  Exam favors skin infection at tattoo site. Will treat with doxycycline. Pt to use warm tub soaks. She will see PCP or return to ED if any changes or problem.   Final diagnoses:  None    **I have reviewed  nursing notes, vital signs, and all appropriate lab and imaging results for this patient.Ivery Quale*    Tc Kapusta, PA-C 05/02/15 [id number]  Bethann BerkshireJoseph Zammit, MD 05/02/15 1501

## 2015-05-02 NOTE — Discharge Instructions (Signed)
Please use warm Epsom salt Soaks daily for 15 minutes. Please use 400 mg of ibuprofen 3 times daily for inflammation. Use doxycycline 2 times daily with food. Please see your primary physician, or return to the emergency department if this condition is worsening.

## 2015-07-03 ENCOUNTER — Encounter (HOSPITAL_COMMUNITY): Payer: Self-pay | Admitting: Emergency Medicine

## 2015-07-03 ENCOUNTER — Emergency Department (HOSPITAL_COMMUNITY)
Admission: EM | Admit: 2015-07-03 | Discharge: 2015-07-03 | Disposition: A | Discharge status: Home / Self Care | Payer: Medicaid Other | Attending: Emergency Medicine | Admitting: Emergency Medicine

## 2015-07-03 DIAGNOSIS — Z3202 Encounter for pregnancy test, result negative: Secondary | ICD-10-CM | POA: Insufficient documentation

## 2015-07-03 DIAGNOSIS — R000 Tachycardia, unspecified: Secondary | ICD-10-CM | POA: Insufficient documentation

## 2015-07-03 DIAGNOSIS — Z79899 Other long term (current) drug therapy: Secondary | ICD-10-CM | POA: Insufficient documentation

## 2015-07-03 DIAGNOSIS — K529 Noninfective gastroenteritis and colitis, unspecified: Secondary | ICD-10-CM

## 2015-07-03 DIAGNOSIS — Z792 Long term (current) use of antibiotics: Secondary | ICD-10-CM | POA: Insufficient documentation

## 2015-07-03 LAB — COMPREHENSIVE METABOLIC PANEL
ALT: 33 U/L (ref 14–54)
ANION GAP: 8 (ref 5–15)
AST: 30 U/L (ref 15–41)
Albumin: 3.9 g/dL (ref 3.5–5.0)
Alkaline Phosphatase: 81 U/L (ref 38–126)
BILIRUBIN TOTAL: 0.8 mg/dL (ref 0.3–1.2)
BUN: 8 mg/dL (ref 6–20)
CALCIUM: 8.7 mg/dL — AB (ref 8.9–10.3)
CO2: 23 mmol/L (ref 22–32)
Chloride: 105 mmol/L (ref 101–111)
Creatinine, Ser: 0.65 mg/dL (ref 0.44–1.00)
GFR calc Af Amer: >60 mL/min (ref >60)
Glucose, Bld: 103 mg/dL — ABNORMAL HIGH (ref 65–99)
POTASSIUM: 3.9 mmol/L (ref 3.5–5.1)
Sodium: 136 mmol/L (ref 135–145)
TOTAL PROTEIN: 7.2 g/dL (ref 6.5–8.1)

## 2015-07-03 LAB — URINALYSIS, ROUTINE W REFLEX MICROSCOPIC
Bilirubin Urine: NEGATIVE
Glucose, UA: NEGATIVE mg/dL
LEUKOCYTES UA: NEGATIVE
NITRITE: NEGATIVE
PH: 6 (ref 5.0–8.0)
Protein, ur: NEGATIVE mg/dL
Specific Gravity, Urine: 1.025 (ref 1.005–1.030)

## 2015-07-03 LAB — CBC
HEMATOCRIT: 38.3 % (ref 36.0–46.0)
HEMOGLOBIN: 13 g/dL (ref 12.0–15.0)
MCH: 29.1 pg (ref 26.0–34.0)
MCHC: 33.9 g/dL (ref 30.0–36.0)
MCV: 85.9 fL (ref 78.0–100.0)
Platelets: 261 10*3/uL (ref 150–400)
RBC: 4.46 MIL/uL (ref 3.87–5.11)
RDW: 12.3 % (ref 11.5–15.5)
WBC: 11.7 10*3/uL — AB (ref 4.0–10.5)

## 2015-07-03 LAB — URINE MICROSCOPIC-ADD ON

## 2015-07-03 LAB — PREGNANCY, URINE: PREG TEST UR: NEGATIVE

## 2015-07-03 LAB — LIPASE, BLOOD: Lipase: 24 U/L (ref 11–51)

## 2015-07-03 MED ORDER — ONDANSETRON 4 MG PREPACK (~~LOC~~): ORAL_TABLET | ORAL | Status: DC

## 2015-07-03 MED ORDER — ACETAMINOPHEN 500 MG PO TABS
1000.0000 mg | ORAL_TABLET | Freq: Once | ORAL | Status: AC
  Administered 2015-07-03: 1000 mg via ORAL
  Filled 2015-07-03: qty 2

## 2015-07-03 MED ORDER — SODIUM CHLORIDE 0.9 % IV SOLN
INTRAVENOUS | Status: AC
  Administered 2015-07-03: 19:00:00 via INTRAVENOUS

## 2015-07-03 MED ORDER — ONDANSETRON HCL 4 MG/2ML IJ SOLN
4.0000 mg | Freq: Once | INTRAMUSCULAR | Status: AC
  Administered 2015-07-03: 4 mg via INTRAVENOUS
  Filled 2015-07-03: qty 2

## 2015-07-03 NOTE — ED Notes (Signed)
Given prepack of Zofran.

## 2015-07-03 NOTE — ED Notes (Signed)
Pt reports generalized abdominal pain and n/v/d that began yesterday.

## 2015-07-03 NOTE — ED Provider Notes (Signed)
CSN: [id number]     Arrival date & time 07/03/15  1735 History   First MD Initiated Contact with Patient 07/03/15 1749     Chief Complaint  Patient presents with  . Abdominal Pain     (Consider location/radiation/quality/duration/timing/severity/associated sxs/prior Treatment) Patient is a 30 y.o. female presenting with abdominal pain. The history is provided by the patient.  Abdominal Pain Pain location:  Generalized Pain radiates to:  Does not radiate Pain severity:  Moderate Onset quality:  Gradual Duration:  2 days Timing:  Constant Chronicity:  New Relieved by:  Nothing Associated symptoms: diarrhea, fever, nausea and vomiting    Kailena C Gravlin is a 30 y.o. female who presents to the ED with nausea, vomiting and diarrhea that started yesterday. She reports vomiting x 4 and having diarrhea more times than she can count. The stools are brown loose. She has felt like she had fever and has been taking Aleve.   Past Medical History  Diagnosis Date  . No pertinent past medical history   . WUJWJXBJ(478.2)    Past Surgical History  Procedure Laterality Date  . No past surgeries     No family history on file. Social History  Substance Use Topics  . Smoking status: Never Smoker   . Smokeless tobacco: None  . Alcohol Use: No   OB History    Gravida Para Term Preterm AB TAB SAB Ectopic Multiple Living   3 3 2 1  0 0 0 0 0 3     Review of Systems  Constitutional: Positive for fever.  Gastrointestinal: Positive for nausea, vomiting, abdominal pain and diarrhea.  Musculoskeletal: Positive for myalgias.   All other systems negative    Allergies  Review of patient's allergies indicates no known allergies.  Home Medications   Prior to Admission medications   Medication Sig Start Date End Date Taking? Authorizing Provider  acetaminophen (TYLENOL) 325 MG tablet Take 650 mg by mouth every 6 (six) hours as needed. For pain    Historical Provider, MD  Calcium Carbonate  Antacid (TUMS PO) Take 1 tablet by mouth daily.    Historical Provider, MD  doxycycline (VIBRAMYCIN) 100 MG capsule Take 1 capsule (100 mg total) by mouth 2 (two) times daily. 05/02/15   Ivery Quale, PA-C  ondansetron [facility]) 4 mg TABS tablet Take one tablet every 6 hours as needed for nausea 07/03/15   Janne Napoleon, NP  Prenatal Vit-Fe Fumarate-FA (PRENATAL MULTIVITAMIN) TABS Take 1 tablet by mouth daily.    Historical Provider, MD   BP 119/76 mmHg  Pulse 98  Temp(Src) 98.8 F (37.1 C) (Oral)  Resp 16  Ht 5\' 4"  (1.626 m)  Wt 74.39 kg  BMI 28.14 kg/m2  SpO2 100%  LMP 07/03/2015 Physical Exam  Constitutional: She is oriented to person, place, and time. She appears well-developed and well-nourished.  HENT:  Head: Normocephalic and atraumatic.  Right Ear: Tympanic membrane normal.  Left Ear: Tympanic membrane normal.  Nose: Nose normal.  Mouth/Throat: Uvula is midline, oropharynx is clear and moist and mucous membranes are normal.  Eyes: Conjunctivae and EOM are normal.  Neck: Normal range of motion. Neck supple.  Cardiovascular: Tachycardia present.   Pulmonary/Chest: Effort normal and breath sounds normal.  Abdominal: Soft. Bowel sounds are normal. There is no tenderness. There is no CVA tenderness.  Unable to reproduce the cramping pain.  Musculoskeletal: Normal range of motion.  Neurological: She is alert and oriented to person, place, and time. No cranial nerve deficit.  Skin: Skin is warm and dry.  Psychiatric: She has a normal mood and affect. Her behavior is normal.  Nursing note and vitals reviewed.   ED Course  Procedures (including critical care time) Labs Review Results for orders placed or performed during the hospital encounter of 07/03/15 (from the past 24 hour(s))  Urinalysis, Routine w reflex microscopic (not at [facility])     Status: Abnormal   Collection Time: 07/03/15  5:45 PM  Result Value Ref Range   Color, Urine YELLOW YELLOW   APPearance CLEAR CLEAR    Specific Gravity, Urine 1.025 1.005 - 1.030   pH 6.0 5.0 - 8.0   Glucose, UA NEGATIVE NEGATIVE mg/dL   Hgb urine dipstick SMALL (A) NEGATIVE   Bilirubin Urine NEGATIVE NEGATIVE   Ketones, ur TRACE (A) NEGATIVE mg/dL   Protein, ur NEGATIVE NEGATIVE mg/dL   Nitrite NEGATIVE NEGATIVE   Leukocytes, UA NEGATIVE NEGATIVE  Pregnancy, urine     Status: None   Collection Time: 07/03/15  5:45 PM  Result Value Ref Range   Preg Test, Ur NEGATIVE NEGATIVE  Urine microscopic-add on     Status: Abnormal   Collection Time: 07/03/15  5:45 PM  Result Value Ref Range   Squamous Epithelial / LPF TOO NUMEROUS TO COUNT (A) NONE SEEN   WBC, UA 0-5 0 - 5 WBC/hpf   RBC / HPF 0-5 0 - 5 RBC/hpf   Bacteria, UA RARE (A) NONE SEEN  Lipase, blood     Status: None   Collection Time: 07/03/15  6:03 PM  Result Value Ref Range   Lipase 24 11 - 51 U/L  Comprehensive metabolic panel     Status: Abnormal   Collection Time: 07/03/15  6:03 PM  Result Value Ref Range   Sodium 136 135 - 145 mmol/L   Potassium 3.9 3.5 - 5.1 mmol/L   Chloride 105 101 - 111 mmol/L   CO2 23 22 - 32 mmol/L   Glucose, Bld 103 (H) 65 - 99 mg/dL   BUN 8 6 - 20 mg/dL   Creatinine, Ser 1.61 0.44 - 1.00 mg/dL   Calcium 8.7 (L) 8.9 - 10.3 mg/dL   Total Protein 7.2 6.5 - 8.1 g/dL   Albumin 3.9 3.5 - 5.0 g/dL   AST 30 15 - 41 U/L   ALT 33 14 - 54 U/L   Alkaline Phosphatase 81 38 - 126 U/L   Total Bilirubin 0.8 0.3 - 1.2 mg/dL   GFR calc non Af Amer >60 >60 mL/min   GFR calc Af Amer >60 >60 mL/min   Anion gap 8 5 - 15  CBC     Status: Abnormal   Collection Time: 07/03/15  6:03 PM  Result Value Ref Range   WBC 11.7 (H) 4.0 - 10.5 K/uL   RBC 4.46 3.87 - 5.11 MIL/uL   Hemoglobin 13.0 12.0 - 15.0 g/dL   HCT 09.6 04.5 - 40.9 %   MCV 85.9 78.0 - 100.0 fL   MCH 29.1 26.0 - 34.0 pg   MCHC 33.9 30.0 - 36.0 g/dL   RDW 81.1 91.4 - 78.2 %   Platelets 261 150 - 400 K/uL     Imaging Review No results found. I have personally reviewed and  evaluated the lab results as part of my medical decision-making.  After IV hydration and Zofran the patient is feeling better and her heart rate is down from 125 to 98. She is able to take PO fluids without nausea. MDM  29  y.o. female with n/v/d x 2 days stable for d/c without severe dehydration and feeling better after IV fluids and Zofran. She will stay on clear liquids tonight and then advance to the [employer] diet. She will continue Zofran as needed for nausea. Discussed with the patient plan of care and all questioned fully answered. She will return if any problems arise.   Final diagnoses:  Gastroenteritis       Janne NapoleonHope M Jusiah Aguayo, NP 07/03/15 2322  Benjiman CoreNathan Pickering, MD 07/04/15 2055

## 2015-07-03 NOTE — Discharge Instructions (Signed)
Stay on clear liquids for the next 12 hours and then advance to the diet we give you for diarrhea. Take the medication for nausea as needed. Return for worsening symptoms.

## 2015-07-06 MED FILL — Ondansetron HCl Tab 4 MG: ORAL | Qty: 4 | Status: AC

## 2016-07-03 ENCOUNTER — Emergency Department (HOSPITAL_COMMUNITY)
Admission: EM | Admit: 2016-07-03 | Discharge: 2016-07-03 | Disposition: A | Discharge status: Home / Self Care | Payer: BLUE CROSS/BLUE SHIELD | Attending: Emergency Medicine | Admitting: Emergency Medicine

## 2016-07-03 ENCOUNTER — Encounter (HOSPITAL_COMMUNITY): Payer: Self-pay | Admitting: Emergency Medicine

## 2016-07-03 DIAGNOSIS — K0889 Other specified disorders of teeth and supporting structures: Secondary | ICD-10-CM

## 2016-07-03 DIAGNOSIS — Z79899 Other long term (current) drug therapy: Secondary | ICD-10-CM | POA: Diagnosis not present

## 2016-07-03 MED ORDER — KETOROLAC TROMETHAMINE 10 MG PO TABS: 10.0000 mg | ORAL_TABLET | Freq: Three times a day (TID) | ORAL | 0 refills | Status: DC | PRN

## 2016-07-03 MED ORDER — CLINDAMYCIN HCL 150 MG PO CAPS
300.0000 mg | ORAL_CAPSULE | Freq: Once | ORAL | Status: AC
  Administered 2016-07-03: 300 mg via ORAL
  Filled 2016-07-03: qty 2

## 2016-07-03 MED ORDER — CLINDAMYCIN HCL 300 MG PO CAPS: 300.0000 mg | ORAL_CAPSULE | Freq: Four times a day (QID) | ORAL | 0 refills | Status: DC

## 2016-07-03 MED ORDER — KETOROLAC TROMETHAMINE 10 MG PO TABS
10.0000 mg | ORAL_TABLET | Freq: Once | ORAL | Status: AC
  Administered 2016-07-03: 10 mg via ORAL
  Filled 2016-07-03: qty 1

## 2016-07-03 NOTE — ED Provider Notes (Signed)
[facility]-EMERGENCY DEPT Provider Note   CSN: [id number] Arrival date & time: 07/03/16  1744   By signing my name below, I, Rose Buchanan, attest that this documentation has been prepared under the direction and in the presence of Rose Benoist PA-C. Electronically Signed: Cynda AcresHailei Buchanan, Scribe. 07/03/16. 6:15 PM.  History   Chief Complaint Chief Complaint  Patient presents with  . Dental Pain    HPI Comments: Rose Buchanan is a 31 y.o. female who presents to the Emergency Department complaining of constant dental pain that began 3 days ago. Patient reports associated radiation to the ear/temple and facial swelling. Patient was supposed to go to the Dentist tomorrow but is unsure if she will. She has been gargling salt water and taking aleve with no improvement in pain. Cold fluids and biting down worsens the pain. She denies any fever or trouble swallowing.   The history is provided by the patient. No language interpreter was used.    Past Medical History:  Diagnosis Date  . Headache(784.0)   . No pertinent past medical history     Patient Active Problem List   Diagnosis Date Noted  . 'light-for-dates' infant with signs of fetal malnutrition 07/22/2011  . Delivery by vacuum extractor affecting fetus or newborn 07/20/2011    Past Surgical History:  Procedure Laterality Date  . NO PAST SURGERIES      OB History    Gravida Para Term Preterm AB Living   3 3 2 1  0 3   SAB TAB Ectopic Multiple Live Births   0 0 0 0 1       Home Medications    Prior to Admission medications   Medication Sig Start Date End Date Taking? Authorizing Provider  acetaminophen (TYLENOL) 325 MG tablet Take 650 mg by mouth every 6 (six) hours as needed. For pain    Historical Provider, MD  Calcium Carbonate Antacid (TUMS PO) Take 1 tablet by mouth daily.    Historical Provider, MD  doxycycline (VIBRAMYCIN) 100 MG capsule Take 1 capsule (100 mg total) by mouth 2 (two) times daily. 05/02/15   Rose Buchanan  Bryant, PA-C  ondansetron [facility](ZOFRAN) 4 mg TABS tablet Take one tablet every 6 hours as needed for nausea 07/03/15   Rose NapoleonHope M Neese, NP  Prenatal Vit-Fe Fumarate-FA (PRENATAL MULTIVITAMIN) TABS Take 1 tablet by mouth daily.    Historical Provider, MD    Family History No family history on file.  Social History Social History  Substance Use Topics  . Smoking status: Never Smoker  . Smokeless tobacco: Not on file  . Alcohol use No     Allergies   Patient has no known allergies.   Review of Systems Review of Systems  Constitutional: Negative for appetite change and fever.  HENT: Positive for dental problem. Negative for congestion, facial swelling, mouth sores, sore throat and trouble swallowing.   Eyes: Negative for pain and visual disturbance.  Musculoskeletal: Negative for neck pain and neck stiffness.  Neurological: Negative for dizziness, facial asymmetry and headaches.  Hematological: Negative for adenopathy.  All other systems reviewed and are negative.    Physical Exam Updated Vital Signs BP 128/77 (BP Location: Left Arm)   Pulse 95   Temp 98.3 F (36.8 C) (Oral)   Resp 18   Ht 5\' 3"  (1.6 m)   Wt 170 lb (77.1 kg)   LMP 06/22/2016 (Exact Date)   SpO2 99%   BMI 30.11 kg/m   Physical Exam  Constitutional: She is oriented  to person, place, and time. She appears well-developed and well-nourished.  HENT:  Head: Normocephalic and atraumatic.  Tenderness and dental decay of the right second pre-molar and first molar.  Moderate erythema of the surrounding gingiva.  Focal facial edema in the right lower jaw.  Poor dentition.   Eyes: Conjunctivae and EOM are normal. Pupils are equal, round, and reactive to light.  Neck: Normal range of motion. Neck supple.  Cardiovascular: Normal rate, regular rhythm and normal heart sounds.   Pulmonary/Chest: Effort normal and breath sounds normal.  Musculoskeletal: Normal range of motion.  Lymphadenopathy:    She has no cervical  adenopathy.  Neurological: She is alert and oriented to person, place, and time.  Skin: Skin is warm and dry. There is erythema.  Psychiatric: She has a normal mood and affect.  Nursing note and vitals reviewed.    ED Treatments / Results  DIAGNOSTIC STUDIES: Oxygen Saturation is 99% on RA, normal by my interpretation.    COORDINATION OF CARE: 6:13 PM Discussed treatment plan with pt at bedside and pt agreed to plan.  Labs (all labs ordered are listed, but only abnormal results are displayed) Labs Reviewed - No data to display  EKG  EKG Interpretation None       Radiology No results found.  Procedures Procedures (including critical care time)  Medications Ordered in ED Medications - No data to display   Initial Impression / Assessment and Plan / ED Course  I have reviewed the triage vital signs and the nursing notes.  Pertinent labs & imaging results that were available during my care of the patient were reviewed by me and considered in my medical decision making (see chart for details).  Clinical Course     Pt is well appearing.  Vitals stable.  Airway is patent.  Likely developing dental abscess.  No drainable abscess at present.  No clinical signs of Ludwig's angina.  Referral info given for local dentistry  Final Clinical Impressions(s) / ED Diagnoses   Final diagnoses:  Pain, dental    New Prescriptions New Prescriptions   No medications on file   I personally performed the services described in this documentation, which was scribed in my presence. The recorded information has been reviewed and is accurate.     Rose Aus, PA-C 07/03/16 1839    Rose Berkshire, MD 07/03/16 2351

## 2016-07-03 NOTE — ED Triage Notes (Signed)
Pt states that her mouth (teeth) has been hurting for 4 days and her gums are swollen.  Pt states that she is in pain.

## 2016-07-03 NOTE — Discharge Instructions (Signed)
Follow-up with a dentist this week for recheck.  Return here for any worsening symptoms

## 2016-08-07 ENCOUNTER — Emergency Department (HOSPITAL_COMMUNITY)
Admission: EM | Admit: 2016-08-07 | Discharge: 2016-08-07 | Disposition: A | Discharge status: Home / Self Care | Payer: BLUE CROSS/BLUE SHIELD | Attending: Emergency Medicine | Admitting: Emergency Medicine

## 2016-08-07 ENCOUNTER — Encounter (HOSPITAL_COMMUNITY): Payer: Self-pay | Admitting: Emergency Medicine

## 2016-08-07 DIAGNOSIS — K029 Dental caries, unspecified: Secondary | ICD-10-CM | POA: Diagnosis not present

## 2016-08-07 DIAGNOSIS — K0889 Other specified disorders of teeth and supporting structures: Secondary | ICD-10-CM | POA: Diagnosis present

## 2016-08-07 DIAGNOSIS — Z791 Long term (current) use of non-steroidal anti-inflammatories (NSAID): Secondary | ICD-10-CM | POA: Insufficient documentation

## 2016-08-07 MED ORDER — TRAMADOL HCL 50 MG PO TABS: 50.0000 mg | ORAL_TABLET | Freq: Four times a day (QID) | ORAL | 0 refills | Status: DC | PRN

## 2016-08-07 MED ORDER — IBUPROFEN 600 MG PO TABS: 600.0000 mg | ORAL_TABLET | Freq: Four times a day (QID) | ORAL | 0 refills | Status: DC

## 2016-08-07 MED ORDER — CLINDAMYCIN HCL 300 MG PO CAPS: ORAL_CAPSULE | ORAL | 0 refills | Status: DC

## 2016-08-07 NOTE — ED Notes (Signed)
Pt verbalized understanding of no driving and to use caution within 4 hours of taking pain meds due to meds cause drowsiness 

## 2016-08-07 NOTE — Discharge Instructions (Signed)
It is important that she see a dentist as sone as possible. Please use clindamycin 3 times daily. Ibuprofen 4 times daily, and Ultram every 6 hours if needed for more severe pain.This medication may cause drowsiness. Please do not drink, drive, or participate in activity that requires concentration while taking this medication.

## 2016-08-07 NOTE — ED Provider Notes (Signed)
[facility]Y DEPT Provider Note   CSN: [id number] Arrival date & time: 08/07/16  [id number]  By signing my name below, I, [name], attest that this documentation has been prepared under the direction and in the presence of  Electronically Signed: [name], Scribe. 08/07/16. 12:22 PM.  History   Chief Complaint Chief Complaint  Patient presents with  . Dental Pain    HPI Comments: Rose Buchanan is a 31 y.o. female who presents to the Emergency Department complaining of sudden-onset, constant dental pain to the right lower jaw that began two days ago. Patient has no associated symptoms, just pain. Patient denies any history of hypertension or diabetes mellitus, no antibiotic allergies. Patient does not have a primary care dentist. Patient denies any fever, chills, vomiting, possible pregnancy, or breast feeding. Patient requesting a work note.   The history is provided by the patient. No language interpreter was used.    Past Medical History:  Diagnosis Date  . Headache(784.0)   . No pertinent past medical history     Patient Active Problem List   Diagnosis Date Noted  . 'light-for-dates' infant with signs of fetal malnutrition 07/22/2011  . Delivery by vacuum extractor affecting fetus or newborn 07/20/2011    Past Surgical History:  Procedure Laterality Date  . NO PAST SURGERIES      OB History    Gravida Para Term Preterm AB Living   3 3 2 1  0 3   SAB TAB Ectopic Multiple Live Births   0 0 0 0 1       Home Medications    Prior to Admission medications   Medication Sig Start Date End Date Taking? Authorizing Provider  acetaminophen (TYLENOL) 325 MG tablet Take 650 mg by mouth every 6 (six) hours as needed. For pain    Historical Provider, MD  Calcium Carbonate Antacid (TUMS PO) Take 1 tablet by mouth daily.    Historical Provider, MD  clindamycin (CLEOCIN) 300 MG capsule Take 1 capsule (300 mg total) by mouth 4 (four) times daily. 07/03/16   Tammy  Triplett, PA-C  doxycycline (VIBRAMYCIN) 100 MG capsule Take 1 capsule (100 mg total) by mouth 2 (two) times daily. 05/02/15   Ivery QualeHobson Ledford Goodson, PA-C  ketorolac (TORADOL) 10 MG tablet Take 1 tablet (10 mg total) by mouth every 8 (eight) hours as needed. Take with food 07/03/16   Tammy Triplett, PA-C  ondansetron (ZOFRAN) 4 mg TABS tablet Take one tablet every 6 hours as needed for nausea 07/03/15   Janne NapoleonHope M Neese, NP  Prenatal Vit-Fe Fumarate-FA (PRENATAL MULTIVITAMIN) TABS Take 1 tablet by mouth daily.    Historical Provider, MD    Family History No family history on file.  Social History Social History  Substance Use Topics  . Smoking status: Never Smoker  . Smokeless tobacco: Never Used  . Alcohol use No     Allergies   Patient has no known allergies.   Review of Systems Review of Systems  Constitutional: Negative for chills and fever.  HENT: Positive for dental problem.   Gastrointestinal: Negative for vomiting.  All other systems reviewed and are negative.    Physical Exam Updated Vital Signs BP 124/73 (BP Location: Left Arm)   Pulse 92   Temp 98.5 F (36.9 C) (Oral)   Resp 16   Ht 5\' 4"  (1.626 m)   Wt 170 lb (77.1 kg)   LMP 07/27/2016   SpO2 100%   BMI 29.18 kg/m   Physical Exam  Constitutional:  She is oriented to person, place, and time. She appears well-developed.  HENT:  Head: Normocephalic and atraumatic.  Right Ear: External ear normal.  Left Ear: External ear normal.  Mouth/Throat: Oropharynx is clear and moist.  There is a cavity of the right lower pre-molar and a cavity to the right lower third molar. There is some swelling of the gum. No visible abscess. No swelling under the tongue, Airway is patent.   Eyes: Conjunctivae and EOM are normal. Pupils are equal, round, and reactive to light.  Neck: Normal range of motion. Neck supple.  No cervical lymph adenopathy.   Cardiovascular: Normal rate, regular rhythm and normal heart sounds.  Exam reveals no gallop  and no friction rub.   No murmur heard. Pulmonary/Chest: Effort normal and breath sounds normal. No respiratory distress. She has no wheezes.  Musculoskeletal: Normal range of motion.  Lymphadenopathy:    She has no cervical adenopathy.  Neurological: She is alert and oriented to person, place, and time.  Skin: Skin is warm and dry.     ED Treatments / Results  DIAGNOSTIC STUDIES: Oxygen Saturation is 100% on RA, normal by my interpretation.    COORDINATION OF CARE: 12:21 PM Discussed treatment plan with pt at bedside and pt agreed to plan, which includes anti-inflammatory pain medication and antibiotics.   Labs (all labs ordered are listed, but only abnormal results are displayed) Labs Reviewed - No data to display  EKG  EKG Interpretation None       Radiology No results found.  Procedures Procedures (including critical care time)  Medications Ordered in ED Medications - No data to display   Initial Impression / Assessment and Plan / ED Course  I have reviewed the triage vital signs and the nursing notes.  Pertinent labs & imaging results that were available during my care of the patient were reviewed by me and considered in my medical decision making (see chart for details).     **I have reviewed nursing notes, vital signs, and all appropriate lab and imaging results for this patient.*  Final Clinical Impressions(s) / ED Diagnoses   MDM: Patient has multiple cavities to the right lower jaw area. The plan at this time will be anti-inflammatory pain medication, antibiotics, and consult with the dentist as soon as possible.   Final diagnoses:  Dental caries    New Prescriptions New Prescriptions   No medications on file   **I personally performed the services described in this documentation, which was scribed in my presence. The recorded information has been reviewed and is accurate.Ivery Quale, PA-C 08/07/16 1236    Benjiman Core,  MD 08/07/16 1525

## 2016-08-07 NOTE — ED Triage Notes (Signed)
Patient complains of dental pain to right lower jaw x 2 days.

## 2016-12-29 ENCOUNTER — Encounter (HOSPITAL_COMMUNITY): Payer: Self-pay

## 2016-12-29 ENCOUNTER — Emergency Department (HOSPITAL_COMMUNITY)
Admission: EM | Admit: 2016-12-29 | Discharge: 2016-12-29 | Disposition: A | Discharge status: Home / Self Care | Payer: BLUE CROSS/BLUE SHIELD | Attending: Emergency Medicine | Admitting: Emergency Medicine

## 2016-12-29 DIAGNOSIS — N898 Other specified noninflammatory disorders of vagina: Secondary | ICD-10-CM | POA: Insufficient documentation

## 2016-12-29 DIAGNOSIS — N72 Inflammatory disease of cervix uteri: Secondary | ICD-10-CM | POA: Diagnosis not present

## 2016-12-29 DIAGNOSIS — R1030 Lower abdominal pain, unspecified: Secondary | ICD-10-CM | POA: Diagnosis present

## 2016-12-29 LAB — BASIC METABOLIC PANEL
Anion gap: 10 (ref 5–15)
BUN: 9 mg/dL (ref 6–20)
CALCIUM: 8.6 mg/dL — AB (ref 8.9–10.3)
CO2: 22 mmol/L (ref 22–32)
Chloride: 104 mmol/L (ref 101–111)
Creatinine, Ser: 0.74 mg/dL (ref 0.44–1.00)
GFR calc Af Amer: >60 mL/min (ref >60)
GLUCOSE: 99 mg/dL (ref 65–99)
Potassium: 3.2 mmol/L — ABNORMAL LOW (ref 3.5–5.1)
Sodium: 136 mmol/L (ref 135–145)

## 2016-12-29 LAB — CBC WITH DIFFERENTIAL/PLATELET
BASOS ABS: 0 10*3/uL (ref 0.0–0.1)
Basophils Relative: 0 %
EOS ABS: 0 10*3/uL (ref 0.0–0.7)
EOS PCT: 0 %
HCT: 39.1 % (ref 36.0–46.0)
Hemoglobin: 13.3 g/dL (ref 12.0–15.0)
LYMPHS ABS: 1.3 10*3/uL (ref 0.7–4.0)
LYMPHS PCT: 31 %
MCH: 29.2 pg (ref 26.0–34.0)
MCHC: 34 g/dL (ref 30.0–36.0)
MCV: 85.9 fL (ref 78.0–100.0)
MONO ABS: 0.6 10*3/uL (ref 0.1–1.0)
Monocytes Relative: 13 %
Neutro Abs: 2.4 10*3/uL (ref 1.7–7.7)
Neutrophils Relative %: 56 %
PLATELETS: 218 10*3/uL (ref 150–400)
RBC: 4.55 MIL/uL (ref 3.87–5.11)
RDW: 12.7 % (ref 11.5–15.5)
WBC: 4.3 10*3/uL (ref 4.0–10.5)

## 2016-12-29 LAB — URINALYSIS, ROUTINE W REFLEX MICROSCOPIC
BILIRUBIN URINE: NEGATIVE
Glucose, UA: NEGATIVE mg/dL
Hgb urine dipstick: NEGATIVE
Ketones, ur: NEGATIVE mg/dL
Leukocytes, UA: NEGATIVE
Nitrite: NEGATIVE
Protein, ur: 30 mg/dL — AB
SPECIFIC GRAVITY, URINE: 1.039 — AB (ref 1.005–1.030)
pH: 5 (ref 5.0–8.0)

## 2016-12-29 LAB — PREGNANCY, URINE: PREG TEST UR: NEGATIVE

## 2016-12-29 LAB — WET PREP, GENITAL
Sperm: NONE SEEN
Trich, Wet Prep: NONE SEEN
Yeast Wet Prep HPF POC: NONE SEEN

## 2016-12-29 MED ORDER — CEFTRIAXONE SODIUM 250 MG IJ SOLR
250.0000 mg | Freq: Once | INTRAMUSCULAR | Status: AC
  Administered 2016-12-29: 250 mg via INTRAMUSCULAR
  Filled 2016-12-29: qty 250

## 2016-12-29 MED ORDER — AZITHROMYCIN 250 MG PO TABS
1000.0000 mg | ORAL_TABLET | Freq: Once | ORAL | Status: AC
  Administered 2016-12-29: 1000 mg via ORAL
  Filled 2016-12-29: qty 4

## 2016-12-29 MED ORDER — LIDOCAINE HCL (PF) 1 % IJ SOLN
INTRAMUSCULAR | Status: AC
  Filled 2016-12-29: qty 5

## 2016-12-29 NOTE — Discharge Instructions (Signed)
Follow-up with an OB GYN doctor to make sure the symptoms resolve and discuss a pap smear as we discussed, return for fever or worsening symptoms

## 2016-12-29 NOTE — ED Triage Notes (Signed)
Pt is complaining of lower abdominal pain starting 2 days ago that is dull. Abdominal discharge started 2 days ago as well. States its a yellow color. NAD.

## 2016-12-29 NOTE — ED Provider Notes (Signed)
[facility] Provider Note   CSN: [id number] Arrival date & time: 12/29/16  1321     History   Chief Complaint Chief Complaint  Patient presents with  . Abdominal Cramping  . Vaginal Discharge    HPI Rose Buchanan is a 31 y.o. female.  HPI Patient presents to the emergency room with points of lower abdominal pain that started a couple days ago. The patient states she's had some abdominal cramping primarily in the suprapubic region. She's noted some yellow vaginal discharge. She's also had some episodes of loose stools. She has had nausea but no vomiting. She denies any dysuria. Past Medical History:  Diagnosis Date  . Headache(784.0)   . No pertinent past medical history     Patient Active Problem List   Diagnosis Date Noted  . 'light-for-dates' infant with signs of fetal malnutrition 07/22/2011  . Delivery by vacuum extractor affecting fetus or newborn 07/20/2011    Past Surgical History:  Procedure Laterality Date  . NO PAST SURGERIES      OB History    Gravida Para Term Preterm AB Living   3 3 2 1  0 3   SAB TAB Ectopic Multiple Live Births   0 0 0 0 1       Home Medications    Prior to Admission medications   Medication Sig Start Date End Date Taking? Authorizing Provider  naproxen sodium (ANAPROX) 220 MG tablet Take 440 mg by mouth 2 (two) times daily as needed (mild pain).    Yes [provider]  clindamycin (CLEOCIN) 300 MG capsule 1 po tid with food Patient not taking: Reported on 12/29/2016 08/07/16   Ivery Quale, PA-C  ibuprofen (ADVIL,MOTRIN) 600 MG tablet Take 1 tablet (600 mg total) by mouth 4 (four) times daily. Patient not taking: Reported on 12/29/2016 08/07/16   Ivery Quale, PA-C  ketorolac (TORADOL) 10 MG tablet Take 1 tablet (10 mg total) by mouth every 8 (eight) hours as needed. Take with food Patient not taking: Reported on 08/07/2016 07/03/16   Pauline Aus, PA-C  ondansetron [facility]) 4 mg TABS tablet Take one tablet  every 6 hours as needed for nausea Patient not taking: Reported on 08/07/2016 07/03/15   Janne Napoleon, NP  traMADol (ULTRAM) 50 MG tablet Take 1 tablet (50 mg total) by mouth every 6 (six) hours as needed. Patient not taking: Reported on 12/29/2016 08/07/16   Ivery Quale, PA-C    Family History No family history on file.  Social History Social History  Substance Use Topics  . Smoking status: Never Smoker  . Smokeless tobacco: Never Used  . Alcohol use No     Allergies   Patient has no known allergies.   Review of Systems Review of Systems  All other systems reviewed and are negative.    Physical Exam Updated Vital Signs BP (!) 125/92 (BP Location: Left Arm)   Pulse 97   Temp 99.3 F (37.4 C) (Oral)   Resp 15   Ht 1.626 m (5\' 4" )   Wt 77.1 kg (170 lb)   LMP 12/23/2016   SpO2 99%   BMI 29.18 kg/m   Physical Exam  Constitutional: She appears well-developed and well-nourished. No distress.  HENT:  Head: Normocephalic and atraumatic.  Right Ear: External ear normal.  Left Ear: External ear normal.  Eyes: Conjunctivae are normal. Right eye exhibits no discharge. Left eye exhibits no discharge. No scleral icterus.  Neck: Neck supple. No tracheal deviation present.  Cardiovascular: Normal  rate, regular rhythm and intact distal pulses.   Pulmonary/Chest: Effort normal and breath sounds normal. No stridor. No respiratory distress. She has no wheezes. She has no rales.  Abdominal: Soft. Bowel sounds are normal. She exhibits no distension and no mass. There is tenderness ( Mild in the suprapubic region). There is no rebound and no guarding. No hernia.  Genitourinary: Uterus normal. Pelvic exam was performed with patient supine. There is no rash, tenderness or lesion on the right labia. There is no rash, tenderness or lesion on the left labia. Cervix exhibits no motion tenderness, no discharge and no friability. Right adnexum displays no mass, no tenderness and no fullness. Left  adnexum displays no mass, no tenderness and no fullness. Vaginal discharge (yellow, thin) found.  Genitourinary Comments: Whitish plaque on cervical os  Musculoskeletal: She exhibits no edema or tenderness.  Neurological: She is alert. She has normal strength. No cranial nerve deficit (no facial droop, extraocular movements intact, no slurred speech) or sensory deficit. She exhibits normal muscle tone. She displays no seizure activity. Coordination normal.  Skin: Skin is warm and dry. No rash noted.  Psychiatric: She has a normal mood and affect.  Nursing note and vitals reviewed.    ED Treatments / Results  Labs (all labs ordered are listed, but only abnormal results are displayed) Labs Reviewed  WET PREP, GENITAL - Abnormal; Notable for the following:       Result Value   Clue Cells Wet Prep HPF POC PRESENT (*)    WBC, Wet Prep HPF POC MODERATE (*)    All other components within normal limits  URINALYSIS, ROUTINE W REFLEX MICROSCOPIC - Abnormal; Notable for the following:    APPearance HAZY (*)    Specific Gravity, Urine 1.039 (*)    Protein, ur 30 (*)    Bacteria, UA FEW (*)    Squamous Epithelial / LPF 0-5 (*)    All other components within normal limits  BASIC METABOLIC PANEL - Abnormal; Notable for the following:    Potassium 3.2 (*)    Calcium 8.6 (*)    All other components within normal limits  PREGNANCY, URINE  CBC WITH DIFFERENTIAL/PLATELET  RPR  HIV ANTIBODY (ROUTINE TESTING)  GC/CHLAMYDIA PROBE AMP ([place]      Procedures Procedures (including critical care time)  Medications Ordered in ED Medications  cefTRIAXone (ROCEPHIN) injection 250 mg (not administered)  azithromycin (ZITHROMAX) tablet 1,000 mg (not administered)     Initial Impression / Assessment and Plan / ED Course  I have reviewed the triage vital signs and the nursing notes.  Pertinent labs & imaging results that were available during my care of the patient were reviewed  by me and considered in my medical decision making (see chart for details).  Clinical Course as of Dec 30 1631  Thu Dec 29, 2016  1449 White plaque on cervical os.  Pt states it has been a few years since she has had a pelvic exam.  Discussed outpatient follow up with an OB GYN to have a pap smear  [JK]    Clinical Course User Index [JK] Linwood DibblesKnapp, Maylen Waltermire, MD   I doubt appendicitis, diverticulitis or other acute abdominal infection Will treat for possible cervicitis.   Discussed the importance of outpatient follow-up with an OB/GYN doctor  Final Clinical Impressions(s) / ED Diagnoses   Final diagnoses:  Cervicitis  Vaginal discharge    New Prescriptions New Prescriptions   No medications on file  Linwood Dibbles, MD 12/29/16 [phone]

## 2016-12-30 LAB — GC/CHLAMYDIA PROBE AMP (~~LOC~~) NOT AT ARMC
Chlamydia: NEGATIVE
Neisseria Gonorrhea: NEGATIVE

## 2016-12-30 LAB — HIV ANTIBODY (ROUTINE TESTING W REFLEX): HIV Screen 4th Generation wRfx: NONREACTIVE

## 2016-12-30 LAB — RPR: RPR Ser Ql: NONREACTIVE

## 2017-01-06 ENCOUNTER — Emergency Department (HOSPITAL_COMMUNITY)
Admission: EM | Admit: 2017-01-06 | Discharge: 2017-01-06 | Disposition: A | Discharge status: Home / Self Care | Payer: BLUE CROSS/BLUE SHIELD | Attending: Emergency Medicine | Admitting: Emergency Medicine

## 2017-01-06 ENCOUNTER — Encounter (HOSPITAL_COMMUNITY): Payer: Self-pay | Admitting: Emergency Medicine

## 2017-01-06 DIAGNOSIS — R1030 Lower abdominal pain, unspecified: Secondary | ICD-10-CM | POA: Insufficient documentation

## 2017-01-06 DIAGNOSIS — Z5321 Procedure and treatment not carried out due to patient leaving prior to being seen by health care provider: Secondary | ICD-10-CM | POA: Diagnosis not present

## 2017-01-06 LAB — URINALYSIS, ROUTINE W REFLEX MICROSCOPIC
Bilirubin Urine: NEGATIVE
GLUCOSE, UA: NEGATIVE mg/dL
HGB URINE DIPSTICK: NEGATIVE
Ketones, ur: NEGATIVE mg/dL
NITRITE: NEGATIVE
PROTEIN: 30 mg/dL — AB
SPECIFIC GRAVITY, URINE: 1.017 (ref 1.005–1.030)
pH: 6 (ref 5.0–8.0)

## 2017-01-06 NOTE — ED Notes (Signed)
Called to place in room. No answer. Other patient's in waiting room states "I saw her leave. She's gone."

## 2017-01-06 NOTE — ED Triage Notes (Signed)
Return for abdominal pain, told if continues to return, pt has no family doctor, pain lower abdomen, some diarrhea no vomiting

## 2017-01-10 ENCOUNTER — Encounter (HOSPITAL_COMMUNITY): Payer: Self-pay | Admitting: Emergency Medicine

## 2017-01-10 ENCOUNTER — Emergency Department (HOSPITAL_COMMUNITY)
Admission: EM | Admit: 2017-01-10 | Discharge: 2017-01-10 | Disposition: A | Discharge status: Home / Self Care | Payer: BLUE CROSS/BLUE SHIELD | Attending: Emergency Medicine | Admitting: Emergency Medicine

## 2017-01-10 DIAGNOSIS — R102 Pelvic and perineal pain: Secondary | ICD-10-CM | POA: Diagnosis present

## 2017-01-10 DIAGNOSIS — N760 Acute vaginitis: Secondary | ICD-10-CM | POA: Diagnosis not present

## 2017-01-10 DIAGNOSIS — N764 Abscess of vulva: Secondary | ICD-10-CM

## 2017-01-10 DIAGNOSIS — B9689 Other specified bacterial agents as the cause of diseases classified elsewhere: Secondary | ICD-10-CM

## 2017-01-10 LAB — WET PREP, GENITAL
Sperm: NONE SEEN
TRICH WET PREP: NONE SEEN
Yeast Wet Prep HPF POC: NONE SEEN

## 2017-01-10 MED ORDER — DOXYCYCLINE HYCLATE 100 MG PO CAPS: 100.0000 mg | ORAL_CAPSULE | Freq: Two times a day (BID) | ORAL | 0 refills | Status: DC

## 2017-01-10 MED ORDER — TRAMADOL HCL 50 MG PO TABS: 50.0000 mg | ORAL_TABLET | Freq: Four times a day (QID) | ORAL | 0 refills | Status: AC | PRN

## 2017-01-10 MED ORDER — METRONIDAZOLE 500 MG PO TABS: 500.0000 mg | ORAL_TABLET | Freq: Two times a day (BID) | ORAL | 0 refills | Status: DC

## 2017-01-10 NOTE — ED Triage Notes (Signed)
Pt states that she noticed a large lump about one week ago with pain that continues to increase

## 2017-01-10 NOTE — ED Provider Notes (Signed)
[facility] Provider Note   CSN: [id number] Arrival date & time: 01/10/17  0231     History   Chief Complaint Chief Complaint  Patient presents with  . Vaginal Pain    HPI Rose Buchanan is a 31 y.o. female.  Patient presents to the emergency department for evaluation of vaginal pain. Patient reports that she has had one week of pain on the left side of her vaginal area. She has noticed some discharge or drainage into her panty liner. Pain has progressively worsened. She reports that there is a tender lump on the left side of her vagina. She is worried because she did shave a week ago with an old razor and is concerned that she might have gotten an infection.      Past Medical History:  Diagnosis Date  . Headache(784.0)   . No pertinent past medical history     Patient Active Problem List   Diagnosis Date Noted  . 'light-for-dates' infant with signs of fetal malnutrition 07/22/2011  . Delivery by vacuum extractor affecting fetus or newborn 07/20/2011    Past Surgical History:  Procedure Laterality Date  . NO PAST SURGERIES      OB History    Gravida Para Term Preterm AB Living   3 3 2 1  0 3   SAB TAB Ectopic Multiple Live Births   0 0 0 0 1       Home Medications    Prior to Admission medications   Medication Sig Start Date End Date Taking? Authorizing Provider  doxycycline (VIBRAMYCIN) 100 MG capsule Take 1 capsule (100 mg total) by mouth 2 (two) times daily. 01/10/17   Gilda Crease, MD  metroNIDAZOLE (FLAGYL) 500 MG tablet Take 1 tablet (500 mg total) by mouth 2 (two) times daily. One po bid x 7 days 01/10/17   Gilda Crease, MD  naproxen sodium (ANAPROX) 220 MG tablet Take 440 mg by mouth 2 (two) times daily as needed (mild pain).     [provider]  traMADol (ULTRAM) 50 MG tablet Take 1 tablet (50 mg total) by mouth every 6 (six) hours as needed. 01/10/17   Gilda Crease, MD    Family History No family  history on file.  Social History Social History  Substance Use Topics  . Smoking status: Never Smoker  . Smokeless tobacco: Never Used  . Alcohol use No     Allergies   Patient has no known allergies.   Review of Systems Review of Systems  Genitourinary: Positive for vaginal discharge and vaginal pain.  All other systems reviewed and are negative.    Physical Exam Updated Vital Signs BP (!) 142/94 (BP Location: Left Arm)   Pulse (!) 110   Temp 98.4 F (36.9 C) (Oral)   Resp 18   Ht 5\' 4"  (1.626 m)   Wt 75.8 kg (167 lb)   LMP 12/23/2016   SpO2 100%   BMI 28.67 kg/m   Physical Exam  Constitutional: She is oriented to person, place, and time. She appears well-developed and well-nourished. No distress.  HENT:  Head: Normocephalic and atraumatic.  Right Ear: Hearing normal.  Left Ear: Hearing normal.  Nose: Nose normal.  Mouth/Throat: Oropharynx is clear and moist and mucous membranes are normal.  Eyes: Pupils are equal, round, and reactive to light. Conjunctivae and EOM are normal.  Neck: Normal range of motion. Neck supple.  Cardiovascular: Regular rhythm, S1 normal and S2 normal.  Exam reveals no gallop  and no friction rub.   No murmur heard. Pulmonary/Chest: Effort normal and breath sounds normal. No respiratory distress. She exhibits no tenderness.  Abdominal: Soft. Normal appearance and bowel sounds are normal. There is no hepatosplenomegaly. There is no tenderness. There is no rebound, no guarding, no tenderness at McBurney's point and negative Murphy's sign. No hernia.  Genitourinary: Uterus normal.    There is lesion on the left labia. Cervix exhibits friability. Cervix exhibits no motion tenderness. Right adnexum displays no mass and no tenderness. Left adnexum displays no mass and no tenderness. Vaginal discharge found.  Musculoskeletal: Normal range of motion.  Neurological: She is alert and oriented to person, place, and time. She has normal strength. No  cranial nerve deficit or sensory deficit. Coordination normal. GCS eye subscore is 4. GCS verbal subscore is 5. GCS motor subscore is 6.  Skin: Skin is warm, dry and intact. No rash noted. No cyanosis.  Psychiatric: She has a normal mood and affect. Her speech is normal and behavior is normal. Thought content normal.  Nursing note and vitals reviewed.    ED Treatments / Results  Labs (all labs ordered are listed, but only abnormal results are displayed) Labs Reviewed  WET PREP, GENITAL - Abnormal; Notable for the following:       Result Value   Clue Cells Wet Prep HPF POC PRESENT (*)    WBC, Wet Prep HPF POC MANY (*)    All other components within normal limits  HSV CULTURE AND TYPING  RPR  HIV ANTIBODY (ROUTINE TESTING)  GC/CHLAMYDIA PROBE AMP ([place]) NOT AT [facility]    EKG  EKG Interpretation None       Radiology No results found.  Procedures Procedures (including critical care time)  Medications Ordered in ED Medications - No data to display   Initial Impression / Assessment and Plan / ED Course  I have reviewed the triage vital signs and the nursing notes.  Pertinent labs & imaging results that were available during my care of the patient were reviewed by me and considered in my medical decision making (see chart for details).     Patient presents to the ER for evaluation of painful lesion on her vagina as well as vaginal discharge. Reviewing records reveals that she was seen in the ER less than 2 weeks ago for discharge, treated with Rocephin and Zithromax at that time. Subsequent GC and chlamydia, RPR, HIV were found to be negative. She continues to have a discharge, now also complaining of painful lesion. Examination reveals an elevated area of the left labia majora with some central ulceration. This does not look typical for syphilis chancre or herpes genitalis. Herpes culture obtained. RPR pending. HIV pending. Blood prep does reveal clue cells, likely the  cause of the patient's discharge. Treat with Flagyl. Suspect that the tender area was an abscess that spontaneously drained causing the central ulceration. Will treat with doxycycline. Follow-up with OB/GYN.  Final Clinical Impressions(s) / ED Diagnoses   Final diagnoses:  Labial abscess  BV (bacterial vaginosis)    New Prescriptions New Prescriptions   DOXYCYCLINE (VIBRAMYCIN) 100 MG CAPSULE    Take 1 capsule (100 mg total) by mouth 2 (two) times daily.   METRONIDAZOLE (FLAGYL) 500 MG TABLET    Take 1 tablet (500 mg total) by mouth 2 (two) times daily. One po bid x 7 days   TRAMADOL (ULTRAM) 50 MG TABLET    Take 1 tablet (50 mg total) by mouth every 6 (  six) hours as needed.     Gilda Crease, MD 01/10/17 0330

## 2017-01-11 LAB — RPR: RPR Ser Ql: NONREACTIVE

## 2017-01-11 LAB — GC/CHLAMYDIA PROBE AMP (~~LOC~~) NOT AT ARMC
Chlamydia: NEGATIVE
Neisseria Gonorrhea: NEGATIVE

## 2017-01-11 LAB — HIV ANTIBODY (ROUTINE TESTING W REFLEX): HIV SCREEN 4TH GENERATION: NONREACTIVE

## 2017-01-12 LAB — HSV CULTURE AND TYPING

## 2017-01-16 ENCOUNTER — Encounter: Payer: Self-pay | Admitting: Obstetrics and Gynecology

## 2017-01-30 ENCOUNTER — Encounter: Payer: Self-pay | Admitting: Women's Health

## 2017-02-21 ENCOUNTER — Ambulatory Visit: Payer: BLUE CROSS/BLUE SHIELD | Admitting: Obstetrics and Gynecology

## 2017-03-20 ENCOUNTER — Encounter: Payer: Self-pay | Admitting: Obstetrics and Gynecology

## 2017-03-20 ENCOUNTER — Ambulatory Visit (INDEPENDENT_AMBULATORY_CARE_PROVIDER_SITE_OTHER): Payer: BLUE CROSS/BLUE SHIELD | Admitting: Obstetrics and Gynecology

## 2017-03-20 DIAGNOSIS — R768 Other specified abnormal immunological findings in serum: Secondary | ICD-10-CM

## 2017-03-20 MED ORDER — VALACYCLOVIR HCL 500 MG PO TABS: 500.0000 mg | ORAL_TABLET | Freq: Every day | ORAL | 12 refills | Status: AC

## 2017-03-20 NOTE — Progress Notes (Signed)
Rose Buchanan is a 31 yo G3P2103 who was seen in the ER in July with vaginal discharge and labial lesion. Discharge was BV and she was treated. 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 She did not receive any treatment. She has had another outbreak since the initially Dx.  She has no complaints today. Last pap was 9/12 and was normal. Nexplanon for contraception.  PE AF VSS Lungs clear Heart RRR  A/P HSV 2  HSV reviewed with pt. Will start suppressive therapy. Pt to return in 1 month for yearly. Declined yearly today.

## 2017-03-20 NOTE — Patient Instructions (Signed)
Genital Herpes Genital herpes is a common sexually transmitted infection (STI) that is caused by a virus. The virus spreads from person to person through sexual contact. Infection can cause itching, blisters, and sores around the genitals or rectum. Symptoms may last several days and then go away This is called an outbreak. However, the virus remains in your body, so you may have more outbreaks in the future. The time between outbreaks varies and can be months or years. Genital herpes affects men and women. It is particularly concerning for pregnant women because the virus can be passed to the baby during delivery and can cause serious problems. Genital herpes is also a concern for people who have a weak disease-fighting (immune) system. What are the causes? This condition is caused by the herpes simplex virus (HSV) type 1 or type 2. The virus may spread through:  Sexual contact with an infected person, including vaginal, anal, and oral sex.  Contact with fluid from a herpes sore.  The skin. This means that you can get herpes from an infected partner even if he or she does not have a visible sore or does not know that he or she is infected. What increases the risk? You are more likely to develop this condition if:  You have sex with many partners.  You do not use latex condoms during sex. What are the signs or symptoms? Most people do not have symptoms (asymptomatic) or have mild symptoms that may be mistaken for other skin problems. Symptoms may include:  Small red bumps near the genitals, rectum, or mouth. These bumps turn into blisters and then turn into sores.  Flu-like symptoms, including:  Fever.  Body aches.  Swollen lymph nodes.  Headache.  Painful urination.  Pain and itching in the genital area or rectal area.  Vaginal discharge.  Tingling or shooting pain in the legs and buttocks. Generally, symptoms are more severe and last longer during the first (primary)  outbreak. Flu-like symptoms are also more common during the primary outbreak. How is this diagnosed? Genital herpes may be diagnosed based on:  A physical exam.  Your medical history.  Blood tests.  A test of a fluid sample (culture) from an open sore. How is this treated? There is no cure for this condition, but treatment with antiviral medicines that are taken by mouth (orally) can do the following:  Speed up healing and relieve symptoms.  Help to reduce the spread of the virus to sexual partners.  Limit the chance of future outbreaks, or make future outbreaks shorter.  Lessen symptoms of future outbreaks. Your health care provider may also recommend pain relief medicines, such as aspirin or ibuprofen. Follow these instructions at home: Sexual activity   Do not have sexual contact during active outbreaks.  Practice safe sex. Latex condoms and female condoms may help prevent the spread of the herpes virus. General instructions   Keep the affected areas dry and clean.  Take over-the-counter and prescription medicines only as told by your health care provider.  Avoid rubbing or touching blisters and sores. If you do touch blisters or sores:  Wash your hands thoroughly with soap and water.  Do not touch your eyes afterward.  To help relieve pain or itching, you may take the following actions as directed by your health care provider:  Apply a cold, wet cloth (cold compress) to affected areas 4-6 times a day.  Apply a substance that protects your skin and reduces bleeding (astringent).  Apply a   gel that helps relieve pain around sores (lidocaine gel).  Take a warm, shallow bath that cleans the genital area (sitz bath).  Keep all follow-up visits as told by your health care provider. This is important. How is this prevented?  Use condoms. Although anyone can get genital herpes during sexual contact, even with the use of a condom, a condom can provide some  protection.  Avoid having multiple sexual partners.  Talk with your sexual partner about any symptoms either of you may have. Also, talk with your partner about any history of STIs.  Get tested for STIs before you have sex. Ask your partner to do the same.  Do not have sexual contact if you have symptoms of genital herpes. Contact a health care provider if:  Your symptoms are not improving with medicine.  Your symptoms return.  You have new symptoms.  You have a fever.  You have abdominal pain.  You have redness, swelling, or pain in your eye.  You notice new sores on other parts of your body.  You are a woman and experience bleeding between menstrual periods.  You have had herpes and you become pregnant or plan to become pregnant. Summary  Genital herpes is a common sexually transmitted infection (STI) that is caused by the herpes simplex virus (HSV) type 1 or type 2.  These viruses are most often spread through sexual contact with an infected person.  You are more likely to develop this condition if you have sex with many partners or you have unprotected sex.  Most people do not have symptoms (asymptomatic) or have mild symptoms that may be mistaken for other skin problems. Symptoms occur as outbreaks that may happen months or years apart.  There is no cure for this condition, but treatment with oral antiviral medicines can reduce symptoms, reduce the chance of spreading the virus to a partner, prevent future outbreaks, or shorten future outbreaks. This information is not intended to replace advice given to you by your health care provider. Make sure you discuss any questions you have with your health care provider. Document Released: 06/10/2000 Document Revised: 05/13/2016 Document Reviewed: 05/13/2016 Elsevier Interactive Patient Education  2017 [employer]  

## 2017-04-25 ENCOUNTER — Ambulatory Visit: Payer: BLUE CROSS/BLUE SHIELD | Admitting: Obstetrics and Gynecology

## 2017-06-07 ENCOUNTER — Encounter (HOSPITAL_COMMUNITY): Payer: Self-pay

## 2017-06-07 ENCOUNTER — Emergency Department (HOSPITAL_COMMUNITY)
Admission: EM | Admit: 2017-06-07 | Discharge: 2017-06-07 | Disposition: A | Discharge status: Home / Self Care | Payer: BLUE CROSS/BLUE SHIELD | Attending: Emergency Medicine | Admitting: Emergency Medicine

## 2017-06-07 DIAGNOSIS — Z79899 Other long term (current) drug therapy: Secondary | ICD-10-CM | POA: Insufficient documentation

## 2017-06-07 DIAGNOSIS — R300 Dysuria: Secondary | ICD-10-CM | POA: Insufficient documentation

## 2017-06-07 LAB — CBC WITH DIFFERENTIAL/PLATELET
BASOS ABS: 0 10*3/uL (ref 0.0–0.1)
BASOS PCT: 0 %
EOS PCT: 5 %
Eosinophils Absolute: 0.3 10*3/uL (ref 0.0–0.7)
HCT: 40.2 % (ref 36.0–46.0)
Hemoglobin: 13.3 g/dL (ref 12.0–15.0)
Lymphocytes Relative: 28 %
Lymphs Abs: 2 10*3/uL (ref 0.7–4.0)
MCH: 29.3 pg (ref 26.0–34.0)
MCHC: 33.1 g/dL (ref 30.0–36.0)
MCV: 88.5 fL (ref 78.0–100.0)
MONO ABS: 0.6 10*3/uL (ref 0.1–1.0)
Monocytes Relative: 8 %
NEUTROS ABS: 4.1 10*3/uL (ref 1.7–7.7)
Neutrophils Relative %: 59 %
PLATELETS: 291 10*3/uL (ref 150–400)
RBC: 4.54 MIL/uL (ref 3.87–5.11)
RDW: 12.6 % (ref 11.5–15.5)
WBC: 7 10*3/uL (ref 4.0–10.5)

## 2017-06-07 LAB — BASIC METABOLIC PANEL
ANION GAP: 7 (ref 5–15)
BUN: 11 mg/dL (ref 6–20)
CALCIUM: 9.1 mg/dL (ref 8.9–10.3)
CO2: 25 mmol/L (ref 22–32)
Chloride: 104 mmol/L (ref 101–111)
Creatinine, Ser: 0.76 mg/dL (ref 0.44–1.00)
Glucose, Bld: 102 mg/dL — ABNORMAL HIGH (ref 65–99)
POTASSIUM: 3.5 mmol/L (ref 3.5–5.1)
SODIUM: 136 mmol/L (ref 135–145)

## 2017-06-07 LAB — URINALYSIS, ROUTINE W REFLEX MICROSCOPIC
BILIRUBIN URINE: NEGATIVE
GLUCOSE, UA: NEGATIVE mg/dL
KETONES UR: NEGATIVE mg/dL
NITRITE: NEGATIVE
PH: 8 (ref 5.0–8.0)
Protein, ur: NEGATIVE mg/dL
Specific Gravity, Urine: 1.023 (ref 1.005–1.030)

## 2017-06-07 LAB — PREGNANCY, URINE: Preg Test, Ur: NEGATIVE

## 2017-06-07 MED ORDER — AMOXICILLIN 500 MG PO CAPS: 500.0000 mg | ORAL_CAPSULE | Freq: Three times a day (TID) | ORAL | 0 refills | Status: AC

## 2017-06-07 NOTE — ED Triage Notes (Signed)
Pt reports bilateral flank pain and frequent urination for the past 3 days.  Pt also requesting HIV test because she had heard that someone she used to be sexual active with has HIV.

## 2017-06-07 NOTE — ED Notes (Signed)
Pt states it does not burn when she pees. States she has the urge to go pee, then once she gets done, she has to go again, but is not able to urinate but a tiny bit.

## 2017-06-07 NOTE — ED Provider Notes (Signed)
[facility] EMERGENCY DEPARTMENT Provider Note   CSN: [id number] Arrival date & time: 06/07/17  1306     History   Chief Complaint Chief Complaint  Patient presents with  . Flank Pain    HPI Rose Buchanan is a 31 y.o. female.  The history is provided by the patient. No language interpreter was used.  Flank Pain  This is a new problem. The current episode started 12 to 24 hours ago. The problem occurs constantly. The problem has not changed since onset.Nothing aggravates the symptoms. Nothing relieves the symptoms.  Pt complains of burning with urination.  Pt reports after she pees she still has the sensation of needing to pee.  Past Medical History:  Diagnosis Date  . Headache(784.0)   . No pertinent past medical history     Patient Active Problem List   Diagnosis Date Noted  . 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 NO PAST SURGERIES      OB History    Gravida Para Term Preterm AB Living   3 3 2 1  0 3   SAB TAB Ectopic Multiple Live Births   0 0 0 0 2       Home Medications    Prior to Admission medications   Medication Sig Start Date End Date Taking? Authorizing Provider  amoxicillin (AMOXIL) 500 MG capsule Take 1 capsule (500 mg total) by mouth 3 (three) times daily. 06/07/17   Elson AreasSofia, Leslie K, PA-C  naproxen sodium (ANAPROX) 220 MG tablet Take 440 mg by mouth 2 (two) times daily as needed (mild pain).     [provider]  traMADol (ULTRAM) 50 MG tablet Take 1 tablet (50 mg total) by mouth every 6 (six) hours as needed. Patient not taking: Reported on 03/20/2017 01/10/17   Gilda CreasePollina, Christopher J, MD  valACYclovir (VALTREX) 500 MG tablet Take 1 tablet (500 mg total) by mouth daily. Can increase to twice a day for 5 days in the event of a recurrence 03/20/17   Hermina StaggersErvin, Michael L, MD    Family History No family history on file.  Social History Social History   Tobacco Use  . Smoking status: Never  Smoker  . Smokeless tobacco: Never Used  Substance Use Topics  . Alcohol use: No  . Drug use: No     Allergies   Patient has no known allergies.   Review of Systems Review of Systems  Genitourinary: Positive for flank pain.  All other systems reviewed and are negative.    Physical Exam Updated Vital Signs BP 140/85 (BP Location: Left Arm)   Pulse (!) 107   Temp 99.1 F (37.3 C) (Oral)   Resp 20   Ht 5\' 4"  (1.626 m)   Wt 77.1 kg (170 lb)   LMP 05/24/2017   SpO2 100%   BMI 29.18 kg/m   Physical Exam  Constitutional: She appears well-developed and well-nourished. No distress.  HENT:  Head: Normocephalic and atraumatic.  Right Ear: External ear normal.  Left Ear: External ear normal.  Mouth/Throat: Oropharynx is clear and moist.  Eyes: Conjunctivae are normal.  Neck: Neck supple.  Cardiovascular: Normal rate and regular rhythm.  No murmur heard. Pulmonary/Chest: Effort normal and breath sounds normal. No respiratory distress.  Abdominal: Soft. There is no tenderness.  Musculoskeletal: She exhibits no edema.  Neurological: She is alert.  Skin: Skin is warm and dry.  Psychiatric: She has a normal mood and affect.  Nursing  note and vitals reviewed.    ED Treatments / Results  Labs (all labs ordered are listed, but only abnormal results are displayed) Labs Reviewed  BASIC METABOLIC PANEL - Abnormal; Notable for the following components:      Result Value   Glucose, Bld 102 (*)    All other components within normal limits  URINALYSIS, ROUTINE W REFLEX MICROSCOPIC - Abnormal; Notable for the following components:   APPearance HAZY (*)    Hgb urine dipstick SMALL (*)    Leukocytes, UA TRACE (*)    Bacteria, UA RARE (*)    Squamous Epithelial / LPF 6-30 (*)    All other components within normal limits  CBC WITH DIFFERENTIAL/PLATELET  PREGNANCY, URINE  HIV ANTIBODY (ROUTINE TESTING)    EKG  EKG Interpretation None       Radiology No results  found.  Procedures Procedures (including critical care time)  Medications Ordered in ED Medications - No data to display   Initial Impression / Assessment and Plan / ED Course  I have reviewed the triage vital signs and the nursing notes.  Pertinent labs & imaging results that were available during my care of the patient were reviewed by me and considered in my medical decision making (see chart for details).     Pt also request HIv test.  Pt reports possible exposure several months ago  Final Clinical Impressions(s) / ED Diagnoses   Final diagnoses:  Dysuria    ED Discharge Orders        Ordered    amoxicillin (AMOXIL) 500 MG capsule  3 times daily     06/07/17 1641    An After Visit Summary was printed and given to the patient.Elson Areas.   Sofia, Leslie K, PA-C 06/07/17 2315    Jacalyn LefevreHaviland, Julie, MD 06/16/17 [id number]

## 2017-06-08 LAB — HIV ANTIBODY (ROUTINE TESTING W REFLEX): HIV SCREEN 4TH GENERATION: NONREACTIVE
